# Patient Record
Sex: Female | Born: 1960 | Race: White | Hispanic: No | Marital: Married | Smoking: Never smoker
Health system: Southern US, Community
[De-identification: ages and names within clinical notes are randomized; demographics above are authoritative.]

## PROBLEM LIST (undated history)

## (undated) DIAGNOSIS — E785 Hyperlipidemia, unspecified: Secondary | ICD-10-CM

## (undated) DIAGNOSIS — F419 Anxiety disorder, unspecified: Secondary | ICD-10-CM

## (undated) HISTORY — PX: COLONOSCOPY: SHX174

## (undated) HISTORY — PX: POLYPECTOMY: SHX149

## (undated) HISTORY — DX: Anxiety disorder, unspecified: F41.9

---

## 2012-02-04 ENCOUNTER — Encounter: Payer: Self-pay | Admitting: Internal Medicine

## 2012-03-11 ENCOUNTER — Encounter: Payer: Self-pay | Admitting: Internal Medicine

## 2012-03-22 ENCOUNTER — Encounter: Payer: Self-pay | Admitting: Internal Medicine

## 2012-04-28 ENCOUNTER — Ambulatory Visit (AMBULATORY_SURGERY_CENTER): Payer: PRIVATE HEALTH INSURANCE | Admitting: *Deleted

## 2012-04-28 VITALS — Ht 66.0 in | Wt 164.0 lb

## 2012-04-28 DIAGNOSIS — Z1211 Encounter for screening for malignant neoplasm of colon: Secondary | ICD-10-CM

## 2012-04-28 MED ORDER — MOVIPREP 100 G PO SOLR: 1.0000 | Freq: Once | ORAL | Status: DC

## 2012-04-28 NOTE — Progress Notes (Signed)
No egg or soy allergy. ewm 

## 2012-05-10 ENCOUNTER — Telehealth: Payer: Self-pay | Admitting: Internal Medicine

## 2012-05-10 DIAGNOSIS — Z1211 Encounter for screening for malignant neoplasm of colon: Secondary | ICD-10-CM

## 2012-05-10 MED ORDER — MOVIPREP 100 G PO SOLR: 1.0000 | Freq: Once | ORAL | Status: DC

## 2012-05-10 NOTE — Telephone Encounter (Signed)
Re-sent moviprep to Pt's pharmacy

## 2012-05-12 ENCOUNTER — Ambulatory Visit (AMBULATORY_SURGERY_CENTER): Payer: PRIVATE HEALTH INSURANCE | Admitting: Internal Medicine

## 2012-05-12 ENCOUNTER — Encounter: Payer: Self-pay | Admitting: Internal Medicine

## 2012-05-12 VITALS — BP 124/92 | HR 76 | Temp 98.1°F | Resp 12 | Ht 66.0 in | Wt 164.0 lb

## 2012-05-12 DIAGNOSIS — D126 Benign neoplasm of colon, unspecified: Secondary | ICD-10-CM

## 2012-05-12 DIAGNOSIS — Z1211 Encounter for screening for malignant neoplasm of colon: Secondary | ICD-10-CM

## 2012-05-12 MED ORDER — SODIUM CHLORIDE 0.9 % IV SOLN: 500.0000 mL | INTRAVENOUS | Status: DC

## 2012-05-12 NOTE — Progress Notes (Signed)
Called to room to assist during endoscopic procedure.  Patient ID and intended procedure confirmed with present staff. Received instructions for my participation in the procedure from the performing physician.  

## 2012-05-12 NOTE — Patient Instructions (Signed)
Colon polyps x 2 removed today, see handouts. Hold aspirin, aspirin products, and anti-inflammatory medications for 1 week. Resume current medications. Call us with any questions or concerns. Thank you!!  YOU HAD AN ENDOSCOPIC PROCEDURE TODAY AT THE [facility]: Refer to the procedure report that was given to you for any specific questions about what was found during the examination.  If the procedure report does not answer your questions, please call your gastroenterologist to clarify.  If you requested that your care partner not be given the details of your procedure findings, then the procedure report has been included in a sealed envelope for you to review at your convenience later.  YOU SHOULD EXPECT: Some feelings of bloating in the abdomen. Passage of more gas than usual.  Walking can help get rid of the air that was put into your GI tract during the procedure and reduce the bloating. If you had a lower endoscopy (such as a colonoscopy or flexible sigmoidoscopy) you may notice spotting of blood in your stool or on the toilet paper. If you underwent a bowel prep for your procedure, then you may not have a normal bowel movement for a few days.  DIET: Your first meal following the procedure should be a light meal and then it is ok to progress to your normal diet.  A half-sandwich or bowl of soup is an example of a good first meal.  Heavy or fried foods are harder to digest and may make you feel nauseous or bloated.  Likewise meals heavy in dairy and vegetables can cause extra gas to form and this can also increase the bloating.  Drink plenty of fluids but you should avoid alcoholic beverages for 24 hours.  ACTIVITY: Your care partner should take you home directly after the procedure.  You should plan to take it easy, moving slowly for the rest of the day.  You can resume normal activity the day after the procedure however you should NOT DRIVE or use heavy machinery for 24 hours (because of  the sedation medicines used during the test).    SYMPTOMS TO REPORT IMMEDIATELY: A gastroenterologist can be reached at any hour.  During normal business hours, 8:30 AM to 5:00 PM Monday through Friday, call [phone].  After hours and on weekends, please call the GI answering service at [phone] who will take a message and have the physician on call contact you.   Following lower endoscopy (colonoscopy or flexible sigmoidoscopy):  Excessive amounts of blood in the stool  Significant tenderness or worsening of abdominal pains  Swelling of the abdomen that is new, acute  Fever of 100F or higher  FOLLOW UP: If any biopsies were taken you will be contacted by phone or by letter within the next 1-3 weeks.  Call your gastroenterologist if you have not heard about the biopsies in 3 weeks.  Our staff will call the home number listed on your records the next business day following your procedure to check on you and address any questions or concerns that you may have at that time regarding the information given to you following your procedure. This is a courtesy call and so if there is no answer at the home number and we have not heard from you through the emergency physician on call, we will assume that you have returned to your regular daily activities without incident.  SIGNATURES/CONFIDENTIALITY: You and/or your care partner have signed paperwork which will be entered into your electronic medical record.  These signatures attest to the fact that that the information above on your After Visit Summary has been reviewed and is understood.  Full responsibility of the confidentiality of this discharge information lies with you and/or your care-partner.  

## 2012-05-12 NOTE — Progress Notes (Signed)
Patient did not experience any of the following events: a burn prior to discharge; a fall within the facility; wrong site/side/patient/procedure/implant event; or a hospital transfer or hospital admission upon discharge from the facility. (G8907) Patient did not have preoperative order for IV antibiotic SSI prophylaxis. (G8918)  

## 2012-05-12 NOTE — Op Note (Signed)
[facility] [address] [place], [address]   COLONOSCOPY PROCEDURE REPORT  PATIENT: Angela Hensley, Angela Hensley  MR#: [id number] BIRTHDATE: 1960/11/03 , 51  yrs. old GENDER: Female ENDOSCOPIST: Beverley Fiedler, MD REFERRED QI:ONGEX, John PROCEDURE DATE:  05/12/2012 PROCEDURE:   Colonoscopy with snare polypectomy ASA CLASS:   Class II INDICATIONS:average risk screening and first colonoscopy. MEDICATIONS: MAC sedation, administered by CRNA and propofol (Diprivan) 350mg  IV  DESCRIPTION OF PROCEDURE:   After the risks benefits and alternatives of the procedure were thoroughly explained, informed consent was obtained.  A digital rectal exam revealed no rectal mass.   The LB [id number]-[id number]  endoscope was introduced through the anus and advanced to the terminal ileum which was intubated for a short distance. No adverse events experienced.   The quality of the prep was good, using MoviPrep  The instrument was then slowly withdrawn as the colon was fully examined.   COLON FINDINGS: The mucosa appeared normal in the terminal ileum. A pedunculated polyp measuring 7 mm in size was found at the hepatic flexure.  A polypectomy was performed using snare cautery. The resection was complete and the polyp tissue was completely retrieved.   A sessile polyp measuring 4 mm in size was found in the rectum.  A polypectomy was performed with a cold snare.  The resection was complete and the polyp tissue was completely retrieved.  Retroflexed views revealed no abnormalities. The time to cecum=7 minutes 27 seconds.  Withdrawal time=14 minutes 53 seconds.  The scope was withdrawn and the procedure completed. COMPLICATIONS: There were no complications.  ENDOSCOPIC IMPRESSION: 1.   Normal mucosa in the terminal ileum 2.   Pedunculated polyp measuring 7 mm in size was found at the hepatic flexure; polypectomy was performed using snare cautery 3.   Sessile polyp measuring 4 mm in size was found  in the rectum; polypectomy was performed with a cold snare  RECOMMENDATIONS: 1.  Hold aspirin, aspirin products, and anti-inflammatory medication for 1 week. 2.  Await pathology results 3.  If the polyps removed today are proven to be adenomatous (pre-cancerous) polyps, you will need a repeat colonoscopy in 5 years.  Otherwise you should continue to follow colorectal cancer screening guidelines for "routine risk" patients with colonoscopy in 10 years.  You will receive a letter within 1-2 weeks with the results of your biopsy as well as final recommendations.  Please call my office if you have not received a letter after 3 weeks.   eSigned:  Beverley Fiedler, MD 05/12/2012 11:19 AM        cc: Creola Corn, MD and The Patient

## 2012-05-13 ENCOUNTER — Telehealth: Payer: Self-pay | Admitting: *Deleted

## 2012-05-13 NOTE — Telephone Encounter (Signed)
  Follow up Call-  Call back number 05/12/2012  Post procedure Call Back phone  # [phone]  Permission to leave phone message Yes     Patient questions:  Do you have a fever, pain , or abdominal swelling? no Pain Score  0 *  Have you tolerated food without any problems? yes  Have you been able to return to your normal activities? yes  Do you have any questions about your discharge instructions: Diet   no Medications  no Follow up visit  no  Do you have questions or concerns about your Care? no  Actions: * If pain score is 4 or above: No action needed, pain <4.

## 2012-05-21 ENCOUNTER — Encounter: Payer: Self-pay | Admitting: Internal Medicine

## 2013-12-12 ENCOUNTER — Ambulatory Visit: Payer: Self-pay | Admitting: Chiropractic Medicine

## 2016-03-11 ENCOUNTER — Ambulatory Visit
Admission: EM | Admit: 2016-03-11 | Discharge: 2016-03-11 | Disposition: A | Discharge status: Home / Self Care | Payer: PRIVATE HEALTH INSURANCE | Attending: Family Medicine | Admitting: Family Medicine

## 2016-03-11 DIAGNOSIS — H1032 Unspecified acute conjunctivitis, left eye: Secondary | ICD-10-CM | POA: Diagnosis not present

## 2016-03-11 MED ORDER — GENTAMICIN SULFATE 0.3 % OP SOLN: 1.0000 [drp] | Freq: Four times a day (QID) | OPHTHALMIC | 0 refills | Status: DC

## 2016-03-11 MED ORDER — PREDNISONE 10 MG (21) PO TBPK: ORAL_TABLET | ORAL | 0 refills | Status: DC

## 2016-03-11 NOTE — ED Triage Notes (Signed)
Pt c/o redness, in her left eye for about 3 weeks.
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
## 2016-11-24 ENCOUNTER — Ambulatory Visit
Admission: EM | Admit: 2016-11-24 | Discharge: 2016-11-24 | Disposition: A | Discharge status: Home / Self Care | Payer: Self-pay | Attending: Family Medicine | Admitting: Family Medicine

## 2016-11-24 DIAGNOSIS — N898 Other specified noninflammatory disorders of vagina: Secondary | ICD-10-CM

## 2016-11-24 DIAGNOSIS — N390 Urinary tract infection, site not specified: Secondary | ICD-10-CM

## 2016-11-24 DIAGNOSIS — N3091 Cystitis, unspecified with hematuria: Secondary | ICD-10-CM

## 2016-11-24 DIAGNOSIS — R110 Nausea: Secondary | ICD-10-CM

## 2016-11-24 HISTORY — DX: Hyperlipidemia, unspecified: E78.5

## 2016-11-24 LAB — URINALYSIS, COMPLETE (UACMP) WITH MICROSCOPIC
Bilirubin Urine: NEGATIVE
GLUCOSE, UA: NEGATIVE mg/dL
KETONES UR: 15 mg/dL — AB
Nitrite: POSITIVE — AB
PROTEIN: 100 mg/dL — AB
Specific Gravity, Urine: 1.025 (ref 1.005–1.030)
pH: 6.5 (ref 5.0–8.0)

## 2016-11-24 MED ORDER — FLUCONAZOLE 150 MG PO TABS: 150.0000 mg | ORAL_TABLET | Freq: Once | ORAL | 0 refills | Status: AC

## 2016-11-24 MED ORDER — SULFAMETHOXAZOLE-TRIMETHOPRIM 800-160 MG PO TABS: 1.0000 | ORAL_TABLET | Freq: Two times a day (BID) | ORAL | 0 refills | Status: AC

## 2016-11-24 MED ORDER — PHENAZOPYRIDINE HCL 200 MG PO TABS: 200.0000 mg | ORAL_TABLET | Freq: Three times a day (TID) | ORAL | 0 refills | Status: DC | PRN
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
## 2016-11-24 NOTE — ED Triage Notes (Signed)
56 year old Caucasian woman is here today with complaints of polyuria, hematuria, dysuria that started today. She states she has been taking OTC Motrin with no relief. She states she does have history of UTIs with the last one being maybe about ren years ago. She denies lower back pain. She states she does have lower abdomen discomfort.

## 2016-11-27 LAB — URINE CULTURE

## 2017-05-26 ENCOUNTER — Encounter: Payer: Self-pay | Admitting: Internal Medicine

## 2017-12-02 ENCOUNTER — Other Ambulatory Visit: Payer: Self-pay

## 2017-12-02 ENCOUNTER — Encounter: Payer: Self-pay | Admitting: Emergency Medicine

## 2017-12-02 ENCOUNTER — Ambulatory Visit
Admission: EM | Admit: 2017-12-02 | Discharge: 2017-12-02 | Disposition: A | Discharge status: Home / Self Care | Payer: Self-pay | Attending: Family Medicine | Admitting: Family Medicine

## 2017-12-02 DIAGNOSIS — R300 Dysuria: Secondary | ICD-10-CM

## 2017-12-02 LAB — URINALYSIS, COMPLETE (UACMP) WITH MICROSCOPIC
Bilirubin Urine: NEGATIVE
GLUCOSE, UA: NEGATIVE mg/dL
KETONES UR: NEGATIVE mg/dL
Nitrite: NEGATIVE
PH: 5.5 (ref 5.0–8.0)
PROTEIN: NEGATIVE mg/dL
Specific Gravity, Urine: 1.015 (ref 1.005–1.030)
WBC, UA: >50 WBC/hpf (ref 0–5)

## 2017-12-02 MED ORDER — NITROFURANTOIN MONOHYD MACRO 100 MG PO CAPS: 100.0000 mg | ORAL_CAPSULE | Freq: Two times a day (BID) | ORAL | 0 refills | Status: DC

## 2017-12-02 NOTE — ED Triage Notes (Signed)
Patient c/o dysuria and frequency that started on Sunday. Denies fever. Denies vaginal discharge.

## 2017-12-02 NOTE — Discharge Instructions (Addendum)
Take medication as prescribed. Rest. Drink plenty of fluids.  ° °Follow up with your primary care physician this week as needed. Return to Urgent care for new or worsening concerns.  ° °

## 2017-12-02 NOTE — ED Provider Notes (Signed)
[facility] ____________________________________________  Time seen: Approximately 12:35 PM  I have reviewed the triage vital signs and the nursing notes.   HISTORY  Chief Complaint Dysuria   HPI Angela Hensley is a 57 y.o. female presenting for evaluation of 2 to 3 days of dysuria.  Patient states symptoms started on Sunday, but improved after drinking a lot of water, but then returned prompting evaluation.  Patient reports urinary frequency with some discomfort during active urination.  Denies abdominal pain, back pain, fevers, vomiting diarrhea.  Does report recent hiking trip which may have been a trigger for urinary changes.  Has not taken any over-the-counter medication for the same complaints.  Denies aggravating or alleviating factors.  Reports otherwise feels well.  Denies recent sickness. Denies recent antibiotic use.    Past Medical History:  Diagnosis Date  . Anxiety   . Hyperlipidemia     There are no active problems to display for this patient.   History reviewed. No pertinent surgical history.   No current facility-administered medications for this encounter.   Current Outpatient Medications:  .  nitrofurantoin, macrocrystal-monohydrate, (MACROBID) 100 MG capsule, Take 1 capsule (100 mg total) by mouth 2 (two) times daily., Disp: 10 capsule, Rfl: 0  Allergies Penicillins  Family History  Problem Relation Age of Onset  . Diabetes Mother   . Diabetes Father   . Breast cancer Maternal Grandmother   . Colon cancer Neg Hx   . Rectal cancer Neg Hx   . Stomach cancer Neg Hx     Social History Social History   Tobacco Use  . Smoking status: Never Smoker  . Smokeless tobacco: Never Used  Substance Use Topics  . Alcohol use: Yes    Comment: 2-4 glasses of wine/week  . Drug use: No    Review of Systems Constitutional: No fever/chills Cardiovascular: Denies chest pain. Respiratory: Denies shortness of breath. Gastrointestinal: No  abdominal pain.  No nausea, no vomiting.  No diarrhea.  Genitourinary: positive for dysuria. Musculoskeletal: Negative for back pain. Skin: Negative for rash.   ____________________________________________   PHYSICAL EXAM:  VITAL SIGNS: ED Triage Vitals  Enc Vitals Group     BP 12/02/17 1206 124/84     Pulse Rate 12/02/17 1206 68     Resp 12/02/17 1206 18     Temp 12/02/17 1206 98.1 F (36.7 C)     Temp Source 12/02/17 1206 Oral     SpO2 12/02/17 1206 98 %     Weight 12/02/17 1204 155 lb (70.3 kg)     Height 12/02/17 1204 5\' 6"  (1.676 m)     Head Circumference --      Peak Flow --      Pain Score 12/02/17 1204 5     Pain Loc --      Pain Edu? --      Excl. in [place]? --     Constitutional: Alert and oriented. Well appearing and in no acute distress. ENT      Head: Normocephalic and atraumatic. Cardiovascular: Normal rate, regular rhythm. Grossly normal heart sounds.  Good peripheral circulation. Respiratory: Normal respiratory effort without tachypnea nor retractions. Breath sounds are clear and equal bilaterally. No wheezes, rales, rhonchi. Gastrointestinal: Soft and nontender.  No CVA tenderness. Musculoskeletal: No midline cervical, thoracic or lumbar tenderness to palpation.  Neurologic:  Normal speech and language.  Speech is normal. No gait instability.  Skin:  Skin is warm, dry Psychiatric: Mood and affect are normal. Speech and behavior are  normal. Patient exhibits appropriate insight and judgment   ___________________________________________   LABS (all labs ordered are listed, but only abnormal results are displayed)  Labs Reviewed  URINALYSIS, COMPLETE (UACMP) WITH MICROSCOPIC - Abnormal; Notable for the following components:      Result Value   Color, Urine STRAW (*)    APPearance HAZY (*)    Hgb urine dipstick TRACE (*)    Leukocytes, UA MODERATE (*)    Bacteria, UA FEW (*)    All other components within normal limits  URINE CULTURE     PROCEDURES Procedures    INITIAL IMPRESSION / ASSESSMENT AND PLAN / ED COURSE  Pertinent labs & imaging results that were available during my care of the patient were reviewed by me and considered in my medical decision making (see chart for details).  Well-appearing patient.  No acute distress.  Presented for evaluation of dysuria.  Discussed with patient urinalysis not clear UTI, however suspect UTI will culture.  Will treat with Macrobid.  Encourage rest, fluids, supportive care.Discussed indication, risks and benefits of medications with patient.  Discussed follow up with Primary care physician this week. Discussed follow up and return parameters including no resolution or any worsening concerns. Patient verbalized understanding and agreed to plan.   ____________________________________________   FINAL CLINICAL IMPRESSION(S) / ED DIAGNOSES  Final diagnoses:  Dysuria     ED Discharge Orders         Ordered    nitrofurantoin, macrocrystal-monohydrate, (MACROBID) 100 MG capsule  2 times daily     12/02/17 1232           Note: This dictation was prepared with Dragon dictation along with smaller phrase technology. Any transcriptional errors that result from this process are unintentional.         Marylene Land, NP 12/02/17 [place]

## 2017-12-04 LAB — URINE CULTURE: Culture: >=100000 — AB

## 2017-12-07 ENCOUNTER — Telehealth (HOSPITAL_COMMUNITY): Payer: Self-pay

## 2017-12-07 NOTE — Telephone Encounter (Signed)
Urine culture positive for e. Coli this was treated with Macrobid at ucc visit. Pt called and made aware.

## 2018-01-30 ENCOUNTER — Ambulatory Visit
Admission: EM | Admit: 2018-01-30 | Discharge: 2018-01-30 | Disposition: A | Discharge status: Home / Self Care | Payer: Self-pay | Attending: Family Medicine | Admitting: Family Medicine

## 2018-01-30 ENCOUNTER — Other Ambulatory Visit: Payer: Self-pay

## 2018-01-30 DIAGNOSIS — J0100 Acute maxillary sinusitis, unspecified: Secondary | ICD-10-CM

## 2018-01-30 DIAGNOSIS — J40 Bronchitis, not specified as acute or chronic: Secondary | ICD-10-CM

## 2018-01-30 MED ORDER — PREDNISONE 20 MG PO TABS: 40.0000 mg | ORAL_TABLET | Freq: Every day | ORAL | 0 refills | Status: DC

## 2018-01-30 MED ORDER — DOXYCYCLINE HYCLATE 100 MG PO CAPS: 100.0000 mg | ORAL_CAPSULE | Freq: Two times a day (BID) | ORAL | 0 refills | Status: DC

## 2018-01-30 MED ORDER — ALBUTEROL SULFATE HFA 108 (90 BASE) MCG/ACT IN AERS: 2.0000 | INHALATION_SPRAY | RESPIRATORY_TRACT | 0 refills | Status: DC | PRN

## 2018-01-30 NOTE — ED Triage Notes (Addendum)
"  I've had sinus issues for past couple weeks." Ears feel full. Coughing up green phlegm at times, other times it is clear.

## 2018-01-30 NOTE — Discharge Instructions (Signed)
Take medication as prescribed. Rest. Drink plenty of fluids.  ° °Follow up with your primary care physician this week as needed. Return to Urgent care for new or worsening concerns.  ° °

## 2018-01-30 NOTE — ED Provider Notes (Signed)
[facility] ____________________________________________  Time seen: Approximately 10:29 AM  I have reviewed the triage vital signs and the nursing notes.   HISTORY  Chief Complaint Facial Pain  HPI Angela Hensley is a 57 y.o. female presenting for evaluation of 3 weeks of runny nose, nasal congestion, sinus pressure, postnasal drainage and approximate 1 week of coughing.  Reports does occasionally hear a wheeze when she is coughing.  Reports possible fever at initial sickness onset, no recent fevers.  Continues to eat and drink well.  Has taken multiple over-the-counter cough and congestion medications as well as herbal supplements without resolution, but reports they do help.  Denies any associated chest pain, shortness of breath, hemoptysis.  Reports her family recently with some similar sickness as well.  Denies other aggravating alleviating factors.  Denies recent cough or congestion sickness. .   Past Medical History:  Diagnosis Date  . Anxiety   . Hyperlipidemia     There are no active problems to display for this patient.   History reviewed. No pertinent surgical history.   No current facility-administered medications for this encounter.   Current Outpatient Medications:  .  albuterol (PROVENTIL HFA;VENTOLIN HFA) 108 (90 Base) MCG/ACT inhaler, Inhale 2 puffs into the lungs every 4 (four) hours as needed for wheezing., Disp: 1 Inhaler, Rfl: 0 .  doxycycline (VIBRAMYCIN) 100 MG capsule, Take 1 capsule (100 mg total) by mouth 2 (two) times daily., Disp: 20 capsule, Rfl: 0 .  predniSONE (DELTASONE) 20 MG tablet, Take 2 tablets (40 mg total) by mouth daily., Disp: 10 tablet, Rfl: 0  Allergies Penicillins  Family History  Problem Relation Age of Onset  . Diabetes Mother   . Diabetes Father   . Breast cancer Maternal Grandmother   . Colon cancer Neg Hx   . Rectal cancer Neg Hx   . Stomach cancer Neg Hx     Social History Social History   Tobacco Use    . Smoking status: Never Smoker  . Smokeless tobacco: Never Used  Substance Use Topics  . Alcohol use: Yes    Comment: 2-4 glasses of wine/week  . Drug use: No    Review of Systems Constitutional: No recent fever. ENT: Some sore throat. As above.  Cardiovascular: Denies chest pain. Respiratory: Denies shortness of breath. Gastrointestinal: No abdominal pain.   Musculoskeletal: Negative for back pain. Skin: Negative for rash.  ____________________________________________   PHYSICAL EXAM:  VITAL SIGNS: ED Triage Vitals  Enc Vitals Group     BP 01/30/18 0950 122/89     Pulse Rate 01/30/18 0950 72     Resp 01/30/18 0950 16     Temp 01/30/18 0950 98.2 F (36.8 C)     Temp Source 01/30/18 0950 Oral     SpO2 01/30/18 0950 98 %     Weight 01/30/18 0951 154 lb 15.7 oz (70.3 kg)     Height 01/30/18 0951 5\' 6"  (1.676 m)     Head Circumference --      Peak Flow --      Pain Score 01/30/18 0951 0     Pain Loc --      Pain Edu? --      Excl. in [place]? --    Constitutional: Alert and oriented. Well appearing and in no acute distress. Eyes: Conjunctivae are normal.  Head: Atraumatic.Mild tenderness to palpation bilateral maxillary sinuses.No frontal sinus tenderness. No swelling. No erythema.   Ears: no erythema, normal TMs bilaterally.   Nose: nasal congestion  with bilateral nasal turbinate erythema and edema.   Mouth/Throat: Mucous membranes are moist.  Oropharynx non-erythematous.No tonsillar swelling or exudate.  Neck: No stridor.  No cervical spine tenderness to palpation. Hematological/Lymphatic/Immunilogical: No cervical lymphadenopathy. Cardiovascular: Normal rate, regular rhythm. Grossly normal heart sounds.  Good peripheral circulation. Respiratory: Normal respiratory effort.  No retractions.  Mild scattered rhonchi.  Minimal scattered inspiratory wheezes.  Intermittent cough noted in room with mild bronchospasm.  Speaks in complete senses.  Good air movement.   Musculoskeletal: Steady gait.  Neurologic:  Normal speech and language. No gait instability. Skin:  Skin is warm, dry and intact. No rash noted. Psychiatric: Mood and affect are normal. Speech and behavior are normal.  ___________________________________________   LABS (all labs ordered are listed, but only abnormal results are displayed)  Labs Reviewed - No data to display   PROCEDURES Procedures   INITIAL IMPRESSION / ASSESSMENT AND PLAN / ED COURSE  Pertinent labs & imaging results that were available during my care of the patient were reviewed by me and considered in my medical decision making (see chart for details).  Well-appearing patient.  No acute distress.  Suspect recent viral upper respiratory infection.  Suspect secondary sinusitis and bronchitis.  Will treat with oral doxycycline, prednisone and PRN albuterol inhaler.  Encourage rest, fluids, supportive care.Discussed indication, risks and benefits of medications with patient.   Discussed follow up with Primary care physician this week. Discussed follow up and return parameters including no resolution or any worsening concerns. Patient verbalized understanding and agreed to plan.   ____________________________________________   FINAL CLINICAL IMPRESSION(S) / ED DIAGNOSES  Final diagnoses:  Acute maxillary sinusitis, recurrence not specified  Bronchitis     ED Discharge Orders         Ordered    doxycycline (VIBRAMYCIN) 100 MG capsule  2 times daily     01/30/18 1015    predniSONE (DELTASONE) 20 MG tablet  Daily     01/30/18 1015    albuterol (PROVENTIL HFA;VENTOLIN HFA) 108 (90 Base) MCG/ACT inhaler  Every 4 hours PRN     01/30/18 1015           Note: This dictation was prepared with Dragon dictation along with smaller phrase technology. Any transcriptional errors that result from this process are unintentional.         Marylene Land, NP 01/30/18 1054

## 2018-02-14 ENCOUNTER — Ambulatory Visit
Admission: EM | Admit: 2018-02-14 | Discharge: 2018-02-14 | Disposition: A | Discharge status: Home / Self Care | Payer: Self-pay | Attending: Emergency Medicine | Admitting: Emergency Medicine

## 2018-02-14 ENCOUNTER — Other Ambulatory Visit: Payer: Self-pay

## 2018-02-14 DIAGNOSIS — R05 Cough: Secondary | ICD-10-CM

## 2018-02-14 DIAGNOSIS — R059 Cough, unspecified: Secondary | ICD-10-CM

## 2018-02-14 DIAGNOSIS — J9801 Acute bronchospasm: Secondary | ICD-10-CM

## 2018-02-14 MED ORDER — FLUTICASONE PROPIONATE 50 MCG/ACT NA SUSP: 2.0000 | Freq: Every day | NASAL | 0 refills | Status: DC

## 2018-02-14 MED ORDER — AZITHROMYCIN 250 MG PO TABS: 250.0000 mg | ORAL_TABLET | Freq: Every day | ORAL | 0 refills | Status: DC

## 2018-02-14 MED ORDER — PREDNISONE 10 MG (21) PO TBPK: ORAL_TABLET | ORAL | 0 refills | Status: DC

## 2018-02-14 MED ORDER — AEROCHAMBER PLUS MISC: 2 refills | Status: DC

## 2018-02-14 NOTE — ED Provider Notes (Signed)
HPI  SUBJECTIVE:  Angela Hensley is a 57 y.o. female who presents with persistent maxillary sinus pain and pressure, greenish nasal congestion, postnasal drip, cough productive of green sputum.  She was seen here 2 weeks ago for a cough and sinus issues, treated with doxycycline, albuterol inhaler, prednisone.  States that she finished the Doxy 5 days ago.  She states that overall she is improving significantly, but is concerned because of the continued symptoms.  No recurrent fevers since finishing the antibiotics, no rhinorrhea, no upper dental pain.  No sore throat.  No wheezing, chest pain, shortness of breath.  She reports chest congestion that clears with coughing.  No allergy symptoms.  She tried Echinacea, elderberry.  She is not using her albuterol inhaler now.  She states that the prednisone, doxycycline and herbal remedies have made her feel better.  Symptoms are worse with talking too much.  States that she is sleeping well at night.  Past medical history negative for asthma, emphysema, COPD, smoking, vaping, diabetes, hypertension.  PMD: Dr. Elizabeth Sauer.   Past Medical History:  Diagnosis Date  . Anxiety   . Hyperlipidemia     History reviewed. No pertinent surgical history.  Family History  Problem Relation Age of Onset  . Diabetes Mother   . Diabetes Father   . Breast cancer Maternal Grandmother   . Colon cancer Neg Hx   . Rectal cancer Neg Hx   . Stomach cancer Neg Hx     Social History   Tobacco Use  . Smoking status: Never Smoker  . Smokeless tobacco: Never Used  Substance Use Topics  . Alcohol use: Yes    Comment: 2-4 glasses of wine/week  . Drug use: No    No current facility-administered medications for this encounter.   Current Outpatient Medications:  .  albuterol (PROVENTIL HFA;VENTOLIN HFA) 108 (90 Base) MCG/ACT inhaler, Inhale 2 puffs into the lungs every 4 (four) hours as needed for wheezing., Disp: 1 Inhaler, Rfl: 0 .  azithromycin (ZITHROMAX) 250 MG  tablet, Take 1 tablet (250 mg total) by mouth daily. 2 tabs po on day 1, 1 tab po on days 2-5, Disp: 6 tablet, Rfl: 0 .  fluticasone (FLONASE) 50 MCG/ACT nasal spray, Place 2 sprays into both nostrils daily., Disp: 16 g, Rfl: 0 .  predniSONE (STERAPRED UNI-PAK 21 TAB) 10 MG (21) TBPK tablet, Dispense one 6 day pack. Take as directed with food., Disp: 21 tablet, Rfl: 0 .  Spacer/Aero-Holding Chambers (AEROCHAMBER PLUS) inhaler, Use as instructed, Disp: 1 each, Rfl: 2  Allergies  Allergen Reactions  . Penicillins Rash     ROS  As noted in HPI.   Physical Exam  BP (!) 138/91 (BP Location: Right Arm)   Pulse 78   Temp 98 F (36.7 C) (Oral)   Resp 18   Ht 5\' 6"  (1.676 m)   Wt 70.3 kg   SpO2 97%   BMI 25.01 kg/m   Constitutional: Well developed, well nourished, no acute distress Eyes:  EOMI, conjunctiva normal bilaterally HENT: Normocephalic, atraumatic,mucus membranes moist.  No purulent nasal congestion.  Slightly erythematous, swollen turbinates.  No sinus tenderness.  Unable to visualize oropharynx due to patient cooperation. Respiratory: Normal inspiratory effort. wheezing throughout all lung fields.  No rales, rhonchi.  No chest wall tenderness.  Fair air movement. Cardiovascular: Normal rate, regular rhythm, no murmurs, rubs, gallops GI: nondistended skin: No rash, skin intact Musculoskeletal: no deformities Neurologic: Alert & oriented x 3, no focal neuro deficits Psychiatric:  Speech and behavior appropriate   ED Course   Medications - No data to display  No orders of the defined types were placed in this encounter.   No results found for this or any previous visit (from the past 24 hour(s)). No results found.  ED Clinical Impression  Cough  Bronchospasm   ED Assessment/Plan  Patient has wheezing throughout all lung fields, suspect continued inflammation/bronchospasm.  Will send home with a spacer and have her restart her albuterol 2 puffs every 4-6 hours  using her spacer as needed for coughing, wheezing.  will start some Flonase for the postnasal drip and saline nasal irrigation with a Milta Deiters med rinse and distilled water.  Will send home with another burst of steroids and a wait-and-see of azithromycin as she is allergic to penicillins.  This will cover pneumonia.  I do not think that she has a peristent sinus infection although I think that her cough is from the postnasal drip.Marland Kitchen  Discussed  MDM, treatment plan, and plan for follow-up with patient.. patient agrees with plan.   Meds ordered this encounter  Medications  . fluticasone (FLONASE) 50 MCG/ACT nasal spray    Sig: Place 2 sprays into both nostrils daily.    Dispense:  16 g    Refill:  0  . Spacer/Aero-Holding Chambers (AEROCHAMBER PLUS) inhaler    Sig: Use as instructed    Dispense:  1 each    Refill:  2  . predniSONE (STERAPRED UNI-PAK 21 TAB) 10 MG (21) TBPK tablet    Sig: Dispense one 6 day pack. Take as directed with food.    Dispense:  21 tablet    Refill:  0  . azithromycin (ZITHROMAX) 250 MG tablet    Sig: Take 1 tablet (250 mg total) by mouth daily. 2 tabs po on day 1, 1 tab po on days 2-5    Dispense:  6 tablet    Refill:  0    *This clinic note was created using [employer]. Therefore, there may be occasional mistakes despite careful proofreading.   ?   Melynda Ripple, MD 02/14/18 1430

## 2018-02-14 NOTE — Discharge Instructions (Addendum)
2 Puffs from your albuterol inhaler using your spacer every 4-6 hours as needed for coughing and wheezing . Flonase for the postnasal drip and saline nasal irrigation with a Milta Deiters med rinse and distilled water.  another burst of steroids and a wait-and-see of azithromycin.  This cough may linger for another week or so.

## 2018-02-14 NOTE — ED Triage Notes (Signed)
Pt seen here 2 weeks ago for similar sx and sinusitis. Still having cough and nasal drainage both green. Wants to get this taken care of before holidays start

## 2018-09-21 ENCOUNTER — Encounter: Payer: Self-pay | Admitting: Emergency Medicine

## 2018-09-21 ENCOUNTER — Ambulatory Visit
Admission: EM | Admit: 2018-09-21 | Discharge: 2018-09-21 | Disposition: A | Discharge status: Home / Self Care | Payer: Self-pay | Attending: Urgent Care | Admitting: Urgent Care

## 2018-09-21 ENCOUNTER — Other Ambulatory Visit: Payer: Self-pay

## 2018-09-21 DIAGNOSIS — R300 Dysuria: Secondary | ICD-10-CM

## 2018-09-21 LAB — URINALYSIS, COMPLETE (UACMP) WITH MICROSCOPIC
Bilirubin Urine: NEGATIVE
Glucose, UA: NEGATIVE mg/dL
Hgb urine dipstick: NEGATIVE
Ketones, ur: NEGATIVE mg/dL
Nitrite: NEGATIVE
Protein, ur: NEGATIVE mg/dL
Specific Gravity, Urine: 1.02 (ref 1.005–1.030)
pH: 7 (ref 5.0–8.0)

## 2018-09-21 MED ORDER — NITROFURANTOIN MONOHYD MACRO 100 MG PO CAPS: 100.0000 mg | ORAL_CAPSULE | Freq: Two times a day (BID) | ORAL | 0 refills | Status: AC

## 2018-09-21 NOTE — ED Provider Notes (Signed)
[address] [place], [place] [address] [phone]   Name: Angela Hensley DOB: 1960/06/01 MRN: [id number] CSN: [id number] PCP: Shon Baton, MD  Arrival date and time:  09/21/18 0940  Chief Complaint:  Dysuria  NOTE: Prior to seeing the patient today, I have reviewed the triage nursing documentation and vital signs. Clinical staff has updated patient's PMH/PSHx, current medication list, and drug allergies/intolerances to ensure comprehensive history available to assist in medical decision making.   History:   HPI: Angela Hensley is a 58 y.o. female who presents today with complaints of urinary symptoms that began with acute onset 2 days ago. Patient experiencing dysuria (burning) and urgency. No gross hematuria or malodorous urine. She has not noted any measurable elevations in her temperature. Patient advising that she recently went on an extended car trip and didn't make stops to void like she should have. She adds that in the past, this has led to the development of urinary tract infections. Patient denies pain in her back and flank area. She notes mild suprapubic pressure. No vaginal pain, bleeding or discharge.   Past Medical History:  Diagnosis Date  . Anxiety   . Hyperlipidemia     History reviewed. No pertinent surgical history.  Family History  Problem Relation Age of Onset  . Diabetes Mother   . Diabetes Father   . Breast cancer Maternal Grandmother   . Colon cancer Neg Hx   . Rectal cancer Neg Hx   . Stomach cancer Neg Hx     Social History   Socioeconomic History  . Marital status: Married    Spouse name: Not on file  . Number of children: Not on file  . Years of education: Not on file  . Highest education level: Not on file  Occupational History  . Not on file  Social Needs  . Financial resource strain: Not on file  . Food insecurity:    Worry: Not on file    Inability: Not on file  . Transportation needs:    Medical: Not on file   Non-medical: Not on file  Tobacco Use  . Smoking status: Never Smoker  . Smokeless tobacco: Never Used  Substance and Sexual Activity  . Alcohol use: Yes    Comment: 2-4 glasses of wine/week  . Drug use: No  . Sexual activity: Yes    Birth control/protection: Post-menopausal  Lifestyle  . Physical activity:    Days per week: Not on file    Minutes per session: Not on file  . Stress: Not on file  Relationships  . Social connections:    Talks on phone: Not on file    Gets together: Not on file    Attends religious service: Not on file    Active member of club or organization: Not on file    Attends meetings of clubs or organizations: Not on file    Relationship status: Not on file  . Intimate partner violence:    Fear of current or ex partner: Not on file    Emotionally abused: Not on file    Physically abused: Not on file    Forced sexual activity: Not on file  Other Topics Concern  . Not on file  Social History Narrative  . Not on file    There are no active problems to display for this patient.   Home Medications:    No outpatient medications have been marked as taking for the 09/21/18 encounter [facility] Encounter).    Allergies:  Penicillins  Review of Systems (ROS): Review of Systems  Constitutional: Negative for chills and fever.  Respiratory: Negative for cough and shortness of breath.   Cardiovascular: Negative for chest pain and palpitations.  Gastrointestinal: Negative for nausea and vomiting.       (+) suprapubic pressure  Genitourinary: Positive for dysuria (burning) and urgency. Negative for decreased urine volume, flank pain, frequency, hematuria, pelvic pain, vaginal bleeding, vaginal discharge and vaginal pain.  Musculoskeletal: Positive for back pain.  Neurological: Negative.      Physical Exam:  Triage Vital Signs ED Triage Vitals  Enc Vitals Group     BP 09/21/18 0951 127/85     Pulse Rate 09/21/18 0951 68     Resp 09/21/18 0951 16      Temp 09/21/18 0951 98 F (36.7 C)     Temp Source 09/21/18 0951 Oral     SpO2 09/21/18 0951 99 %     Weight 09/21/18 0949 155 lb (70.3 kg)     Height 09/21/18 0949 5\' 6"  (1.676 m)     Head Circumference --      Peak Flow --      Pain Score 09/21/18 0948 4     Pain Loc --      Pain Edu? --      Excl. in [place]? --     Physical Exam  Constitutional: She is oriented to person, place, and time and well-developed, well-nourished, and in no distress.  HENT:  Head: Normocephalic and atraumatic.  Mouth/Throat: Oropharynx is clear and moist and mucous membranes are normal.  Cardiovascular: Normal rate, regular rhythm, normal heart sounds and intact distal pulses. Exam reveals no gallop and no friction rub.  No murmur heard. Pulmonary/Chest: Effort normal and breath sounds normal. No respiratory distress. She has no wheezes. She has no rales.  Abdominal: Soft. Normal appearance and bowel sounds are normal. There is no hepatosplenomegaly. There is abdominal tenderness in the suprapubic area. There is no CVA tenderness.  Neurological: She is alert and oriented to person, place, and time.  Skin: Skin is warm and dry. No rash noted. No erythema.  Psychiatric: Mood, affect and judgment normal.  Nursing note and vitals reviewed.    Urgent Care Treatments / Results:   LABS: PLEASE NOTE: all labs that were ordered this encounter are listed, however only abnormal results are displayed. Labs Reviewed  URINALYSIS, COMPLETE (UACMP) WITH MICROSCOPIC - Abnormal; Notable for the following components:      Result Value   APPearance CLOUDY (*)    Leukocytes,Ua SMALL (*)    Bacteria, UA FEW (*)    All other components within normal limits    EKG: -None  RADIOLOGY: No results found.  PRODEDURES: Procedures  MEDICATIONS RECEIVED THIS VISIT: Medications - No data to display  PERTINENT CLINICAL COURSE NOTES/UPDATES:   Initial Impression / Assessment and Plan / Urgent Care Course:    Angela Hensley is a 59 y.o. female who presents to [facility] today with complaints of Dysuria  Pertinent labs & imaging results that were available during my care of the patient were personally reviewed by me and considered in my medical decision making (see lab/imaging section of note for values and interpretations).  Exam reveals some mild suprapubic pressure. She notes dysuria and urgency. No back or flank pain. UA not concerning for infection. Patient notes that symptoms are early. She "had time today' thus came in before things "got bad" in terms of her symptoms. Patient encouraged  to increase fluid intake to flush urinary tract. Discussed avoiding caffeine to prevent painful bladder spasms. Offered pyridium, however patient refused citing that she would be fine with APAP and IBU alone. Patient adamant that symptoms are early and that she is developing an infection; "I always feel like this". Dicussed a provisional Rx for antibiotics for patient to fill should symptoms increase. Patient in agreement. She states, "I will increase my fluids and try to flush it out. I don't want to take the medicine if I don't have to. I will wait a few days". Treatment approach reasonable. Past urine C&S results reviewed demonstrating resistance to SMZ-TMP. Patient is PCN allergic. Will provide provisional Rx for a 5 day course of nitrofurantoin.  Discussed follow up with primary care physician in 1 week for re-evaluation. I have reviewed the follow up and strict return precautions for any new or worsening symptoms. Patient is aware of symptoms that would be deemed urgent/emergent, and would thus require further evaluation either here or in the emergency department. At the time of discharge, she verbalized understanding and consent with the discharge plan as it was reviewed with her. All questions were fielded by provider and/or clinic staff prior to patient discharge.    Final Clinical Impressions(s) / Urgent Care  Diagnoses:   Final diagnoses:  Dysuria    New Prescriptions:   Meds ordered this encounter  Medications  . nitrofurantoin, macrocrystal-monohydrate, (MACROBID) 100 MG capsule    Sig: Take 1 capsule (100 mg total) by mouth 2 (two) times daily for 5 days.    Dispense:  10 capsule    Refill:  0    Controlled Substance Prescriptions:  [place] Controlled Substance Registry consulted? Not Applicable  NOTE: This note was prepared using Dragon dictation software along with smaller phrase technology. Despite my best ability to proofread, there is the potential that transcriptional errors may still occur from this process, and are completely unintentional.     Karen Kitchens, NP 09/22/18 [phone]

## 2018-09-21 NOTE — ED Triage Notes (Signed)
Patient c/o burning when urinating and urinary urgency for the past 2 days.  Patient denies fevers.

## 2018-09-21 NOTE — Discharge Instructions (Addendum)
It was very nice meeting you today in clinic. Thank you for entrusting me with your care.   As discussed, your urine shows early possible infection. Will approach treatment as follows:  Prescription has been sent to your pharmacy. If symptoms worsen over the next few days, please pick up prescription. If you start it, please FINISH the entire course of antibiotics even if you are feeling better.  A culture will be sent on your provided sample. If it comes back resistant to what I have prescribed you, someone will call you and let you know that we will need to change antibiotics. Increase fluid intake as much as possible to flush your urinary tract.  Water is always the best.  Avoid caffeine until your infection clears up, as it can contribute to painful bladder spasms.  May use Tylenol and/or Ibuprofen as needed for pain/fever.  Make arrangements to follow up with your regular doctor in 1 week for re-evaluation. If your symptoms/condition worsens, please seek follow up care either here or in the ER. Please remember, our [place] providers are "right here with you" when you need [place].   Again, it was my pleasure to take care of you today. Thank you for choosing our clinic. I hope that you start to feel better quickly.   Honor Loh, MSN, APRN, FNP-C, CEN Advanced Practice Provider [facility]

## 2019-07-01 ENCOUNTER — Encounter: Payer: Self-pay | Admitting: Internal Medicine

## 2019-07-04 ENCOUNTER — Other Ambulatory Visit: Payer: Self-pay | Admitting: Internal Medicine

## 2019-07-04 DIAGNOSIS — E785 Hyperlipidemia, unspecified: Secondary | ICD-10-CM

## 2019-08-19 ENCOUNTER — Other Ambulatory Visit: Payer: Self-pay

## 2019-08-19 ENCOUNTER — Ambulatory Visit (AMBULATORY_SURGERY_CENTER): Payer: Self-pay | Admitting: *Deleted

## 2019-08-19 VITALS — Temp 97.1°F | Ht 65.5 in | Wt 161.0 lb

## 2019-08-19 DIAGNOSIS — Z86010 Personal history of colonic polyps: Secondary | ICD-10-CM

## 2019-08-19 MED ORDER — PEG 3350-KCL-NA BICARB-NACL 420 G PO SOLR: 4000.0000 mL | Freq: Once | ORAL | 0 refills | Status: AC

## 2019-08-19 NOTE — Progress Notes (Signed)
No egg or soy allergy known to patient  No issues with past sedation with any surgeries  or procedures, no intubation problems  No diet pills per patient No home 02 use per patient  No blood thinners per patient  Pt denies issues with constipation  No A fib or A flutter  EMMI video sent to pt's e mail   Completed Levan Hurst 08-03-2019   Due to the COVID-19 pandemic we are asking patients to follow these guidelines. Please only bring one care partner. Please be aware that your care partner may wait in the car in the parking lot or if they feel like they will be too hot to wait in the car, they may wait in the lobby on the 4th floor. All care partners are required to wear a mask the entire time (we do not have any that we can provide them), they need to practice social distancing, and we will do a Covid check for all patient's and care partners when you arrive. Also we will check their temperature and your temperature. If the care partner waits in their car they need to stay in the parking lot the entire time and we will call them on their cell phone when the patient is ready for discharge so they can bring the car to the front of the building. Also all patient's will need to wear a mask into building.

## 2019-08-26 ENCOUNTER — Ambulatory Visit (AMBULATORY_SURGERY_CENTER): Payer: Self-pay | Admitting: Internal Medicine

## 2019-08-26 ENCOUNTER — Encounter: Payer: Self-pay | Admitting: Internal Medicine

## 2019-08-26 ENCOUNTER — Other Ambulatory Visit: Payer: Self-pay

## 2019-08-26 VITALS — BP 107/65 | HR 59 | Temp 96.9°F | Resp 12 | Ht 65.5 in | Wt 161.0 lb

## 2019-08-26 DIAGNOSIS — D124 Benign neoplasm of descending colon: Secondary | ICD-10-CM

## 2019-08-26 DIAGNOSIS — Z86010 Personal history of colonic polyps: Secondary | ICD-10-CM

## 2019-08-26 DIAGNOSIS — K635 Polyp of colon: Secondary | ICD-10-CM

## 2019-08-26 MED ORDER — SODIUM CHLORIDE 0.9 % IV SOLN: 500.0000 mL | Freq: Once | INTRAVENOUS | Status: DC

## 2019-08-26 NOTE — Patient Instructions (Signed)
HANDOUTS PROVIDED ON: POLYPS & DIVERTICULOSIS  The polyps removed today have been sent for pathology.  The results can take 1-3 weeks to receive.  When your next colonoscopy should occur will be based on the pathology results.    You may resume your previous diet and medication schedule.  Thank you for allowing us to care for you today!!!   YOU HAD AN ENDOSCOPIC PROCEDURE TODAY AT THE [facility]:   Refer to the procedure report that was given to you for any specific questions about what was found during the examination.  If the procedure report does not answer your questions, please call your gastroenterologist to clarify.  If you requested that your care partner not be given the details of your procedure findings, then the procedure report has been included in a sealed envelope for you to review at your convenience later.  YOU SHOULD EXPECT: Some feelings of bloating in the abdomen. Passage of more gas than usual.  Walking can help get rid of the air that was put into your GI tract during the procedure and reduce the bloating. If you had a lower endoscopy (such as a colonoscopy or flexible sigmoidoscopy) you may notice spotting of blood in your stool or on the toilet paper. If you underwent a bowel prep for your procedure, you may not have a normal bowel movement for a few days.  Please Note:  You might notice some irritation and congestion in your nose or some drainage.  This is from the oxygen used during your procedure.  There is no need for concern and it should clear up in a day or so.  SYMPTOMS TO REPORT IMMEDIATELY:   Following lower endoscopy (colonoscopy or flexible sigmoidoscopy):  Excessive amounts of blood in the stool  Significant tenderness or worsening of abdominal pains  Swelling of the abdomen that is new, acute  Fever of 100F or higher  For urgent or emergent issues, a gastroenterologist can be reached at any hour by calling [phone]. Do not use  MyChart messaging for urgent concerns.    DIET:  We do recommend a small meal at first, but then you may proceed to your regular diet.  Drink plenty of fluids but you should avoid alcoholic beverages for 24 hours.  ACTIVITY:  You should plan to take it easy for the rest of today and you should NOT DRIVE or use heavy machinery until tomorrow (because of the sedation medicines used during the test).    FOLLOW UP: Our staff will call the number listed on your records 48-72 hours following your procedure to check on you and address any questions or concerns that you may have regarding the information given to you following your procedure. If we do not reach you, we will leave a message.  We will attempt to reach you two times.  During this call, we will ask if you have developed any symptoms of COVID 19. If you develop any symptoms (ie: fever, flu-like symptoms, shortness of breath, cough etc.) before then, please call [phone].  If you test positive for Covid 19 in the 2 weeks post procedure, please call and report this information to us.    If any biopsies were taken you will be contacted by phone or by letter within the next 1-3 weeks.  Please call us at [phone] if you have not heard about the biopsies in 3 weeks.    SIGNATURES/CONFIDENTIALITY: You and/or your care partner have signed paperwork which will be   entered into your electronic medical record.  These signatures attest to the fact that that the information above on your After Visit Summary has been reviewed and is understood.  Full responsibility of the confidentiality of this discharge information lies with you and/or your care-partner. 

## 2019-08-26 NOTE — Progress Notes (Signed)
Called to room to assist during endoscopic procedure.  Patient ID and intended procedure confirmed with present staff. Received instructions for my participation in the procedure from the performing physician.  

## 2019-08-26 NOTE — Op Note (Signed)
[place] Patient Name: Angela Hensley Procedure Date: 08/26/2019 9:31 AM MRN: [id number] Endoscopist: Jerene Bears , MD Age: 59 Referring MD:  Date of Birth: 04-18-1961 Gender: Female Account #: [id number] Procedure:                Colonoscopy Indications:              High risk colon cancer surveillance: Personal                            history of hyperplastic right-sided colonic polyps,                            Last colonoscopy: January 2014 Medicines:                Monitored Anesthesia Care Procedure:                Pre-Anesthesia Assessment:                           - Prior to the procedure, a History and Physical                            was performed, and patient medications and                            allergies were reviewed. The patient's tolerance of                            previous anesthesia was also reviewed. The risks                            and benefits of the procedure and the sedation                            options and risks were discussed with the patient.                            All questions were answered, and informed consent                            was obtained. Prior Anticoagulants: The patient has                            taken no previous anticoagulant or antiplatelet                            agents. ASA Grade Assessment: II - A patient with                            mild systemic disease. After reviewing the risks                            and benefits, the patient was deemed in  satisfactory condition to undergo the procedure.                           After obtaining informed consent, the colonoscope                            was passed under direct vision. Throughout the                            procedure, the patient's blood pressure, pulse, and                            oxygen saturations were monitored continuously. The                            Colonoscope was introduced  through the anus and                            advanced to the cecum, identified by appendiceal                            orifice and ileocecal valve. The colonoscopy was                            performed without difficulty. The patient tolerated                            the procedure well. The quality of the bowel                            preparation was good. Scope In: 9:37:35 AM Scope Out: 9:57:56 AM Scope Withdrawal Time: 0 hours 15 minutes 5 seconds  Total Procedure Duration: 0 hours 20 minutes 21 seconds  Findings:                 The digital rectal exam was normal.                           A 5 mm polyp was found in the proximal transverse                            colon. The polyp was sessile. The polyp was removed                            with a cold snare. Resection was complete, but the                            polyp tissue was not retrieved.                           Two sessile polyps were found in the descending                            colon. The polyps were 3 to 6 mm  in size. These                            polyps were removed with a cold snare. Resection                            and retrieval were complete.                           Multiple small-mouthed diverticula were found in                            the sigmoid colon.                           The retroflexed view of the distal rectum and anal                            verge was normal and showed no anal or rectal                            abnormalities. Complications:            No immediate complications. Estimated Blood Loss:     Estimated blood loss was minimal. Impression:               - One 5 mm polyp in the proximal transverse colon,                            removed with a cold snare. Complete resection.                            Polyp tissue not retrieved.                           - Two 3 to 6 mm polyps in the descending colon,                            removed with a cold  snare. Resected and retrieved.                           - Diverticulosis in the sigmoid colon.                           - The distal rectum and anal verge are normal on                            retroflexion view. Recommendation:           - Patient has a contact number available for                            emergencies. The signs and symptoms of potential                            delayed complications were  discussed with the                            patient. Return to normal activities tomorrow.                            Written discharge instructions were provided to the                            patient.                           - Resume previous diet.                           - Continue present medications.                           - Await pathology results.                           - Repeat colonoscopy is recommended. The                            colonoscopy date will be determined after pathology                            results from today's exam become available for                            review. Jerene Bears, MD 08/26/2019 10:00:28 AM This report has been signed electronically.

## 2019-08-26 NOTE — Progress Notes (Signed)
LS- temp Dt - VS   Pt's states no medical or surgical changes since previsit or office visit. Maw  Pt reported she did not drink approximately 12 oz of her Golytely pre d/t nausea.  She felt if she drank the rest of the prep that she would vomite.  No vomiting noted.  Pt reported clear liquid last results from prep. maw

## 2019-08-26 NOTE — Progress Notes (Signed)
Report to PACU, RN, vss, BBS= Clear.  

## 2019-08-30 ENCOUNTER — Telehealth: Payer: Self-pay | Admitting: *Deleted

## 2019-08-30 ENCOUNTER — Encounter: Payer: Self-pay | Admitting: Internal Medicine

## 2019-08-30 NOTE — Telephone Encounter (Signed)
Second attempt, left VM.  

## 2019-08-30 NOTE — Telephone Encounter (Signed)
  Follow up Call-  Call back number 08/26/2019  Post procedure Call Back phone  # [phone] cell  Permission to leave phone message Yes  Some recent data might be hidden     Patient questions:  Message left to call us if necessary.

## 2020-01-07 ENCOUNTER — Other Ambulatory Visit: Payer: Self-pay

## 2020-01-07 ENCOUNTER — Encounter: Payer: Self-pay | Admitting: Emergency Medicine

## 2020-01-07 ENCOUNTER — Ambulatory Visit
Admission: EM | Admit: 2020-01-07 | Discharge: 2020-01-07 | Disposition: A | Discharge status: Home / Self Care | Payer: Self-pay | Attending: Emergency Medicine | Admitting: Emergency Medicine

## 2020-01-07 DIAGNOSIS — N390 Urinary tract infection, site not specified: Secondary | ICD-10-CM | POA: Insufficient documentation

## 2020-01-07 LAB — URINALYSIS, COMPLETE (UACMP) WITH MICROSCOPIC
Bilirubin Urine: NEGATIVE
Glucose, UA: NEGATIVE mg/dL
Hgb urine dipstick: NEGATIVE
Ketones, ur: NEGATIVE mg/dL
Nitrite: NEGATIVE
Protein, ur: NEGATIVE mg/dL
Specific Gravity, Urine: 1.025 (ref 1.005–1.030)
pH: 6 (ref 5.0–8.0)

## 2020-01-07 MED ORDER — FLUCONAZOLE 200 MG PO TABS: ORAL_TABLET | ORAL | 0 refills | Status: DC

## 2020-01-07 MED ORDER — NITROFURANTOIN MONOHYD MACRO 100 MG PO CAPS: 100.0000 mg | ORAL_CAPSULE | Freq: Two times a day (BID) | ORAL | 0 refills | Status: AC

## 2020-01-07 NOTE — ED Triage Notes (Signed)
Patient c/o burning when urinating and urinary frequency that started on Thursday.

## 2020-01-07 NOTE — Discharge Instructions (Addendum)
Your urine is concerning for mild UTI so I have sent additional antibiotics. I have collected a culture as well to further assess.  There is some yeast in your urine as well, which may also be contributing. Take fluconazole at completion of antibiotics.  Continue to follow with gynecology as they may have additional treatment options for you.

## 2020-01-07 NOTE — ED Provider Notes (Signed)
[facility]    CSN: [id number] Arrival date & time: 01/07/20  1331      History   Chief Complaint Chief Complaint  Patient presents with  . Dysuria    HPI Angela Hensley is a 59 y.o. female.   Angela Hensley presents with complaints of burning with urination, as well as urgency. Has tried taking motrin and increasing fluids which have helped. Was given bactrim for 7 days, started on 8/30,  for her sinus symptoms and had similar urinary symptoms. Urinary symptoms had resolved with this. Urinary symptoms restarted 9/15. 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 Vulva has been more irritated recently. Saw gynecology last in June of this year. Historically had used clobetasol but ran out. She has been applying OTC hydrocortisone. She has been riding bikes more recently as well. No back pain, no fevers. No nausea or vomiting.    ROS per HPI, negative if not otherwise mentioned.         Past Medical History:  Diagnosis Date  . Anxiety    past   . Hyperlipidemia     There are no problems to display for this patient.   Past Surgical History:  Procedure Laterality Date  . COLONOSCOPY    . POLYPECTOMY      OB History   No obstetric history on file.      Home Medications    Prior to Admission medications   Medication Sig Start Date End Date Taking? Authorizing Provider  Ascorbic Acid (VITAMIN C) 1000 MG tablet Take 1,000 mg by mouth daily.   Yes [provider]  cholecalciferol (VITAMIN D3) 25 MCG (1000 UNIT) tablet Take 1,000 Units by mouth daily.   Yes [provider]  Multiple Vitamin (MULTIVITAMIN) tablet Take 1 tablet by mouth daily.   Yes [provider]  Omega-3 Fatty Acids (FISH OIL) 1000 MG CAPS Take by mouth.   Yes [provider]  albuterol (PROVENTIL HFA;VENTOLIN HFA) 108 (90 Base) MCG/ACT inhaler Inhale 2 puffs into the lungs every 4 (four) hours as needed for wheezing. Patient not taking: Reported on  08/19/2019 01/30/18   Marylene Land, NP  Calcium-Magnesium-Vitamin D (CALCIUM MAGNESIUM PO) Take by mouth.    [provider]  fluconazole (DIFLUCAN) 200 MG tablet At completion of antibiotics 01/07/20   Zigmund Gottron, NP  fluticasone (FLONASE) 50 MCG/ACT nasal spray Place 2 sprays into both nostrils daily. Patient not taking: Reported on 08/19/2019 02/14/18   Melynda Ripple, MD  nitrofurantoin, macrocrystal-monohydrate, (MACROBID) 100 MG capsule Take 1 capsule (100 mg total) by mouth 2 (two) times daily for 5 days. 01/07/20 01/12/20  Zigmund Gottron, NP  Spacer/Aero-Holding Chambers (AEROCHAMBER PLUS) inhaler Use as instructed Patient not taking: Reported on 08/26/2019 02/14/18   Melynda Ripple, MD    Family History Family History  Problem Relation Age of Onset  . Diabetes Mother   . Diabetes Father   . Breast cancer Maternal Grandmother   . Colon cancer Neg Hx   . Rectal cancer Neg Hx   . Stomach cancer Neg Hx   . Colon polyps Neg Hx   . Esophageal cancer Neg Hx     Social History Social History   Tobacco Use  . Smoking status: Never Smoker  . Smokeless tobacco: Never Used  Vaping Use  . Vaping Use: Never used  Substance Use Topics  . Alcohol use: Yes    Comment: 2-4 glasses of wine/week  . Drug use: No  Allergies   Penicillins   Review of Systems Review of Systems   Physical Exam Triage Vital Signs ED Triage Vitals  Enc Vitals Group     BP 01/07/20 1346 (!) 144/83     Pulse Rate 01/07/20 1346 73     Resp 01/07/20 1346 14     Temp 01/07/20 1346 98 F (36.7 C)     Temp Source 01/07/20 1346 Oral     SpO2 01/07/20 1346 100 %     Weight 01/07/20 1341 158 lb (71.7 kg)     Height 01/07/20 1341 5' 5.5" (1.664 m)     Head Circumference --      Peak Flow --      Pain Score 01/07/20 1341 1     Pain Loc --      Pain Edu? --      Excl. in [place]? --    No data found.  Updated Vital Signs BP (!) 144/83 (BP Location: Right Arm)   Pulse 73   Temp  98 F (36.7 C) (Oral)   Resp 14   Ht 5' 5.5" (1.664 m)   Wt 158 lb (71.7 kg)   SpO2 100%   BMI 25.89 kg/m   Physical Exam Constitutional:      General: She is not in acute distress.    Appearance: She is well-developed.  Cardiovascular:     Rate and Rhythm: Normal rate.  Pulmonary:     Effort: Pulmonary effort is normal.  Abdominal:     Tenderness: There is no right CVA tenderness or left CVA tenderness.  Skin:    General: Skin is warm and dry.  Neurological:     Mental Status: She is alert and oriented to person, place, and time.      UC Treatments / Results  Labs (all labs ordered are listed, but only abnormal results are displayed) Labs Reviewed  URINALYSIS, COMPLETE (UACMP) WITH MICROSCOPIC - Abnormal; Notable for the following components:      Result Value   APPearance HAZY (*)    Leukocytes,Ua SMALL (*)    Bacteria, UA FEW (*)    All other components within normal limits  URINE CULTURE    EKG   Radiology No results found.  Procedures Procedures (including critical care time)  Medications Ordered in UC Medications - No data to display  Initial Impression / Assessment and Plan / UC Course  I have reviewed the triage vital signs and the nursing notes.  Pertinent labs & imaging results that were available during my care of the patient were reviewed by me and considered in my medical decision making (see chart for details).    Small leuks and few bacteria, also with budding yeast. Culture obtained and pending with macrobid initiated pending results. Diflucan provided as well for use at completion. Follow up with gyne recommended. Return precautions provided. Patient verbalized understanding and agreeable to plan.   Final Clinical Impressions(s) / UC Diagnoses   Final diagnoses:  Lower urinary tract infectious disease     Discharge Instructions     Your urine is concerning for mild UTI so I have sent additional antibiotics. I have collected a  culture as well to further assess.  There is some yeast in your urine as well, which may also be contributing. Take fluconazole at completion of antibiotics.  Continue to follow with gynecology as they may have additional treatment options for you.     ED Prescriptions    Medication Sig Dispense Auth.  Provider   fluconazole (DIFLUCAN) 200 MG tablet At completion of antibiotics 1 tablet Augusto Gamble B, NP   nitrofurantoin, macrocrystal-monohydrate, (MACROBID) 100 MG capsule Take 1 capsule (100 mg total) by mouth 2 (two) times daily for 5 days. 10 capsule Zigmund Gottron, NP     PDMP not reviewed this encounter.   Zigmund Gottron, NP 01/07/20 1428

## 2020-01-09 LAB — URINE CULTURE

## 2020-01-24 ENCOUNTER — Other Ambulatory Visit: Payer: Self-pay

## 2020-01-24 ENCOUNTER — Other Ambulatory Visit: Payer: Self-pay | Admitting: Internal Medicine

## 2020-01-24 ENCOUNTER — Ambulatory Visit
Admission: RE | Admit: 2020-01-24 | Discharge: 2020-01-24 | Disposition: A | Discharge status: Home / Self Care | Payer: Self-pay | Source: Ambulatory Visit | Attending: Internal Medicine | Admitting: Internal Medicine

## 2020-01-24 DIAGNOSIS — R319 Hematuria, unspecified: Secondary | ICD-10-CM

## 2020-02-03 ENCOUNTER — Other Ambulatory Visit: Payer: Self-pay

## 2020-02-17 ENCOUNTER — Other Ambulatory Visit: Payer: Self-pay

## 2021-02-07 NOTE — Progress Notes (Deleted)
[place] Phone: [phone] Subjective:    I'm seeing this patient by the request  of:  Shon Baton, MD  CC:   [id number]  Altheia Shafran is a 60 y.o. female coming in with complaint of L shoulder pain. Patient states   Onset-  Location Duration-  Character- Aggravating factors- Reliving factors-  Therapies tried-  Severity-     Past Medical History:  Diagnosis Date   Anxiety    past    Hyperlipidemia    Past Surgical History:  Procedure Laterality Date   COLONOSCOPY     POLYPECTOMY     Social History   Socioeconomic History   Marital status: Married    Spouse name: Not on file   Number of children: Not on file   Years of education: Not on file   Highest education level: Not on file  Occupational History   Not on file  Tobacco Use   Smoking status: Never   Smokeless tobacco: Never  Vaping Use   Vaping Use: Never used  Substance and Sexual Activity   Alcohol use: Yes    Comment: 2-4 glasses of wine/week   Drug use: No   Sexual activity: Yes    Birth control/protection: Post-menopausal  Other Topics Concern   Not on file  Social History Narrative   Not on file   Social Determinants of Health   Financial Resource Strain: Not on file  Food Insecurity: Not on file  Transportation Needs: Not on file  Physical Activity: Not on file  Stress: Not on file  Social Connections: Not on file   Allergies  Allergen Reactions   Penicillins Rash   Family History  Problem Relation Age of Onset   Diabetes Mother    Diabetes Father    Breast cancer Maternal Grandmother    Colon cancer Neg Hx    Rectal cancer Neg Hx    Stomach cancer Neg Hx    Colon polyps Neg Hx    Esophageal cancer Neg Hx       Current Outpatient Medications (Respiratory):    albuterol (PROVENTIL HFA;VENTOLIN HFA) 108 (90 Base) MCG/ACT inhaler, Inhale 2 puffs into the lungs every 4 (four) hours as needed  for wheezing. (Patient not taking: Reported on 08/19/2019)   fluticasone (FLONASE) 50 MCG/ACT nasal spray, Place 2 sprays into both nostrils daily. (Patient not taking: Reported on 08/19/2019)    Current Outpatient Medications (Other):    Ascorbic Acid (VITAMIN C) 1000 MG tablet, Take 1,000 mg by mouth daily.   Calcium-Magnesium-Vitamin D (CALCIUM MAGNESIUM PO), Take by mouth.   cholecalciferol (VITAMIN D3) 25 MCG (1000 UNIT) tablet, Take 1,000 Units by mouth daily.   fluconazole (DIFLUCAN) 200 MG tablet, At completion of antibiotics   Multiple Vitamin (MULTIVITAMIN) tablet, Take 1 tablet by mouth daily.   Omega-3 Fatty Acids (FISH OIL) 1000 MG CAPS, Take by mouth.   Spacer/Aero-Holding Chambers (AEROCHAMBER PLUS) inhaler, Use as instructed (Patient not taking: Reported on 08/26/2019)   Reviewed prior external information including notes and imaging from  primary care provider As well as notes that were available from care everywhere and other healthcare systems.  Past medical history, social, surgical and family history all reviewed in electronic medical record.  No pertanent information unless stated regarding to the chief complaint.   Review of Systems:  No headache, visual changes, nausea, vomiting, diarrhea, constipation, dizziness, abdominal pain, skin rash, fevers, chills, night sweats, weight loss, swollen  lymph nodes, body aches, joint swelling, chest pain, shortness of breath, mood changes. POSITIVE muscle aches  Objective  There were no vitals taken for this visit.   General: No apparent distress alert and oriented x3 mood and affect normal, dressed appropriately.  HEENT: Pupils equal, extraocular movements intact  Respiratory: Patient's speak in full sentences and does not appear short of breath  Cardiovascular: No lower extremity edema, non tender, no erythema  Gait normal with good balance and coordination.  MSK:  Non tender with full range of motion and good stability and  symmetric strength and tone of shoulders, elbows, wrist, hip, knee and ankles bilaterally.     Impression and Recommendations:     The above documentation has been reviewed and is accurate and complete [name]

## 2021-02-08 ENCOUNTER — Ambulatory Visit: Payer: Self-pay | Admitting: Family Medicine

## 2021-03-21 NOTE — Progress Notes (Deleted)
[place] Phone: [phone] Subjective:    I'm seeing this patient by the request  of:  Shon Baton, MD  CC:   [id number]  Angela Hensley is a 60 y.o. female coming in with complaint of L shoulder pain. Patient states        Past Medical History:  Diagnosis Date   Anxiety    past    Hyperlipidemia    Past Surgical History:  Procedure Laterality Date   COLONOSCOPY     POLYPECTOMY     Social History   Socioeconomic History   Marital status: Married    Spouse name: Not on file   Number of children: Not on file   Years of education: Not on file   Highest education level: Not on file  Occupational History   Not on file  Tobacco Use   Smoking status: Never   Smokeless tobacco: Never  Vaping Use   Vaping Use: Never used  Substance and Sexual Activity   Alcohol use: Yes    Comment: 2-4 glasses of wine/week   Drug use: No   Sexual activity: Yes    Birth control/protection: Post-menopausal  Other Topics Concern   Not on file  Social History Narrative   Not on file   Social Determinants of Health   Financial Resource Strain: Not on file  Food Insecurity: Not on file  Transportation Needs: Not on file  Physical Activity: Not on file  Stress: Not on file  Social Connections: Not on file   Allergies  Allergen Reactions   Penicillins Rash   Family History  Problem Relation Age of Onset   Diabetes Mother    Diabetes Father    Breast cancer Maternal Grandmother    Colon cancer Neg Hx    Rectal cancer Neg Hx    Stomach cancer Neg Hx    Colon polyps Neg Hx    Esophageal cancer Neg Hx       Current Outpatient Medications (Respiratory):    albuterol (PROVENTIL HFA;VENTOLIN HFA) 108 (90 Base) MCG/ACT inhaler, Inhale 2 puffs into the lungs every 4 (four) hours as needed for wheezing. (Patient not taking: Reported on 08/19/2019)   fluticasone (FLONASE) 50 MCG/ACT nasal spray, Place 2  sprays into both nostrils daily. (Patient not taking: Reported on 08/19/2019)    Current Outpatient Medications (Other):    Ascorbic Acid (VITAMIN C) 1000 MG tablet, Take 1,000 mg by mouth daily.   Calcium-Magnesium-Vitamin D (CALCIUM MAGNESIUM PO), Take by mouth.   cholecalciferol (VITAMIN D3) 25 MCG (1000 UNIT) tablet, Take 1,000 Units by mouth daily.   fluconazole (DIFLUCAN) 200 MG tablet, At completion of antibiotics   Multiple Vitamin (MULTIVITAMIN) tablet, Take 1 tablet by mouth daily.   Omega-3 Fatty Acids (FISH OIL) 1000 MG CAPS, Take by mouth.   Spacer/Aero-Holding Chambers (AEROCHAMBER PLUS) inhaler, Use as instructed (Patient not taking: Reported on 08/26/2019)   Reviewed prior external information including notes and imaging from  primary care provider As well as notes that were available from care everywhere and other healthcare systems.  Past medical history, social, surgical and family history all reviewed in electronic medical record.  No pertanent information unless stated regarding to the chief complaint.   Review of Systems:  No headache, visual changes, nausea, vomiting, diarrhea, constipation, dizziness, abdominal pain, skin rash, fevers, chills, night sweats, weight loss, swollen lymph nodes, body aches, joint swelling, chest pain, shortness of breath, mood changes. POSITIVE  muscle aches  Objective  There were no vitals taken for this visit.   General: No apparent distress alert and oriented x3 mood and affect normal, dressed appropriately.  HEENT: Pupils equal, extraocular movements intact  Respiratory: Patient's speak in full sentences and does not appear short of breath  Cardiovascular: No lower extremity edema, non tender, no erythema  Gait normal with good balance and coordination.  MSK:  Non tender with full range of motion and good stability and symmetric strength and tone of shoulders, elbows, wrist, hip, knee and ankles bilaterally.     Impression and  Recommendations:     The above documentation has been reviewed and is accurate and complete [name]

## 2021-03-22 ENCOUNTER — Ambulatory Visit: Payer: Self-pay | Admitting: Family Medicine

## 2021-06-12 ENCOUNTER — Other Ambulatory Visit: Payer: Self-pay | Admitting: Internal Medicine

## 2021-06-12 DIAGNOSIS — E785 Hyperlipidemia, unspecified: Secondary | ICD-10-CM

## 2022-03-24 ENCOUNTER — Encounter: Payer: Self-pay | Admitting: Emergency Medicine

## 2022-03-24 ENCOUNTER — Ambulatory Visit
Admission: EM | Admit: 2022-03-24 | Discharge: 2022-03-24 | Disposition: A | Discharge status: Home / Self Care | Payer: Self-pay | Attending: Internal Medicine | Admitting: Internal Medicine

## 2022-03-24 DIAGNOSIS — N390 Urinary tract infection, site not specified: Secondary | ICD-10-CM

## 2022-03-24 LAB — URINALYSIS, MICROSCOPIC (REFLEX): WBC, UA: >50 WBC/hpf (ref 0–5)

## 2022-03-24 LAB — URINALYSIS, ROUTINE W REFLEX MICROSCOPIC
Bilirubin Urine: NEGATIVE
Glucose, UA: NEGATIVE mg/dL
Nitrite: NEGATIVE
Protein, ur: NEGATIVE mg/dL
Specific Gravity, Urine: 1.015 (ref 1.005–1.030)
pH: 5.5 (ref 5.0–8.0)

## 2022-03-24 MED ORDER — NITROFURANTOIN MONOHYD MACRO 100 MG PO CAPS: 100.0000 mg | ORAL_CAPSULE | Freq: Two times a day (BID) | ORAL | 0 refills | Status: DC

## 2022-03-24 NOTE — Discharge Instructions (Addendum)
Your urine shows sign of infection, so I have sent an antibiotic and I have ordered a urine culture. We will call you if we need to make changes to your medication. You may sign up for mychart to check on your results.

## 2022-03-24 NOTE — ED Triage Notes (Signed)
Pt c/o dysuria, urinary frequency and urgency. Started about a week ago. She states it has been intermittent because she has been pushing lots of fluids.

## 2022-03-25 ENCOUNTER — Ambulatory Visit: Payer: Self-pay

## 2022-03-25 NOTE — ED Provider Notes (Addendum)
[facility]     CSN: [id number] Arrival date & time: 03/24/22  1730      History   Chief Complaint Chief Complaint  Patient presents with   Dysuria    HPI Angela Hensley is a 61 y.o. female who presents with onset of dysuria, frequency and urgency which started one week ago, but she has been taking cranberry pills and has helped off and on, but today the dysuria got worse and saw blood in the tissue when she wiped.  Her last UTI was 1 year ago. Denies fever or flank pain.    Past Medical History:  Diagnosis Date   Anxiety    past    Hyperlipidemia     There are no problems to display for this patient.   Past Surgical History:  Procedure Laterality Date   COLONOSCOPY     POLYPECTOMY      OB History   No obstetric history on file.      Home Medications    Prior to Admission medications   Medication Sig Start Date End Date Taking? Authorizing Provider  Ascorbic Acid (VITAMIN C) 1000 MG tablet Take 1,000 mg by mouth daily.   Yes [provider]  cholecalciferol (VITAMIN D3) 25 MCG (1000 UNIT) tablet Take 1,000 Units by mouth daily.   Yes [provider]  Multiple Vitamin (MULTIVITAMIN) tablet Take 1 tablet by mouth daily.   Yes [provider]  nitrofurantoin, macrocrystal-monohydrate, (MACROBID) 100 MG capsule Take 1 capsule (100 mg total) by mouth 2 (two) times daily. 03/24/22  Yes Rodriguez-Southworth, Sunday Spillers, PA-C  Omega-3 Fatty Acids (FISH OIL) 1000 MG CAPS Take by mouth.   Yes [provider]  Red Yeast Rice Extract (RED YEAST RICE PO) Take by mouth.   Yes [provider]    Family History Family History  Problem Relation Age of Onset   Diabetes Mother    Diabetes Father    Breast cancer Maternal Grandmother    Colon cancer Neg Hx    Rectal cancer Neg Hx    Stomach cancer Neg Hx    Colon polyps Neg Hx    Esophageal cancer Neg Hx     Social History Social History   Tobacco Use   Smoking status:  Never   Smokeless tobacco: Never  Vaping Use   Vaping Use: Never used  Substance Use Topics   Alcohol use: Yes    Comment: 2-4 glasses of wine/week   Drug use: No     Allergies   Penicillins   Review of Systems Review of Systems  Constitutional:  Negative for fever.  Gastrointestinal:  Negative for abdominal pain and constipation.  Genitourinary:  Positive for dysuria, frequency and urgency. Negative for flank pain.     Physical Exam Triage Vital Signs ED Triage Vitals  Enc Vitals Group     BP 03/24/22 1851 118/76     Pulse Rate 03/24/22 1851 65     Resp 03/24/22 1851 16     Temp 03/24/22 1851 98.4 F (36.9 C)     Temp Source 03/24/22 1851 Oral     SpO2 03/24/22 1851 97 %     Weight 03/24/22 1849 158 lb 1.1 oz (71.7 kg)     Height 03/24/22 1849 5' 5.5" (1.664 m)     Head Circumference --      Peak Flow --      Pain Score 03/24/22 1848 4     Pain Loc --  Pain Edu? --      Excl. in [place]? --    No data found.  Updated Vital Signs BP 118/76 (BP Location: Left Arm)   Pulse 65   Temp 98.4 F (36.9 C) (Oral)   Resp 16   Ht 5' 5.5" (1.664 m)   Wt 158 lb 1.1 oz (71.7 kg)   SpO2 97%   BMI 25.90 kg/m   Visual Acuity Right Eye Distance:   Left Eye Distance:   Bilateral Distance:    Right Eye Near:   Left Eye Near:    Bilateral Near:      Physical Exam Vitals and nursing note reviewed.  Constitutional:      General: She is not in acute distress.    Appearance: She is not toxic-appearing.  HENT:     Head: Normocephalic.     Right Ear: External ear normal.     Left Ear: External ear normal.  Eyes:     General: No scleral icterus.    Conjunctiva/sclera: Conjunctivae normal.  Pulmonary:     Effort: Pulmonary effort is normal.  Abdominal:     General: Bowel sounds are normal.     Palpations: Abdomen is soft. There is no mass.     Tenderness: There is no guarding or rebound.     Comments: - CVA tenderness   Musculoskeletal:        General: Normal  range of motion.     Cervical back: Neck supple.    Skin:    General: Skin is warm and dry.     Findings: No rash.  Neurological:     Mental Status: She is alert and oriented to person, place, and time.     Gait: Gait normal.  Psychiatric:        Mood and Affect: Mood normal.        Behavior: Behavior normal.        Thought Content: Thought content normal.        Judgment: Judgment normal.   UC Treatments / Results  Labs (all labs ordered are listed, but only abnormal results are displayed) Labs Reviewed  URINALYSIS, ROUTINE W REFLEX MICROSCOPIC - Abnormal; Notable for the following components:      Result Value   APPearance CLOUDY (*)    Hgb urine dipstick MODERATE (*)    Ketones, ur TRACE (*)    Leukocytes,Ua SMALL (*)    All other components within normal limits  URINALYSIS, MICROSCOPIC (REFLEX) - Abnormal; Notable for the following components:   Bacteria, UA FEW (*)    All other components within normal limits  URINE CULTURE    EKG   Radiology No results found.  Procedures Procedures (including critical care time)  Medications Ordered in UC Medications - No data to display  Initial Impression / Assessment and Plan / UC Course  I have reviewed the triage vital signs and the nursing notes.  Pertinent labs  results that were available during my care of the patient were reviewed by me and considered in my medical decision making (see chart for details).  UTI  I sent the urine for a culture and we will call her if we need to make changes to her medication I placed her on Macrobid and pyridium as noted.    Final Clinical Impressions(s) / UC Diagnoses  UTI Final diagnoses:  Lower urinary tract infectious disease     Discharge Instructions      Your urine shows sign of infection, so I  have sent an antibiotic and I have ordered a urine culture. We will call you if we need to make changes to your medication. You may sign up for mychart to check on your  results.     ED Prescriptions     Medication Sig Dispense Auth. Provider   nitrofurantoin, macrocrystal-monohydrate, (MACROBID) 100 MG capsule Take 1 capsule (100 mg total) by mouth 2 (two) times daily. 10 capsule Rodriguez-Southworth, Sunday Spillers, PA-C      PDMP not reviewed this encounter.   Shelby Mattocks, PA-C 03/25/22 1434    Rodriguez-Southworth, [place], [place] 03/25/22 1436

## 2022-03-26 LAB — URINE CULTURE: Culture: NO GROWTH

## 2022-05-10 IMAGING — CT CT ABD-PELV W/O CM
1 of 2 series · 14 of 32 positions shown, 19 images · non-contrast
Comparison: None.

CLINICAL DATA: Microhematuria. Dysuria for several days. UTI
symptoms. Left-sided pressure for 2 weeks.

EXAM:
CT ABDOMEN AND PELVIS WITHOUT CONTRAST
TECHNIQUE: Multidetector CT imaging of the abdomen and pelvis was performed
following the standard protocol without IV contrast.

[Series 2: renal standard/full · axial · 0.79mm/px · z∈[-477,-102]mm · 14 of 85 slices shown, 19 images]
[im 5/85  soft-tissue]
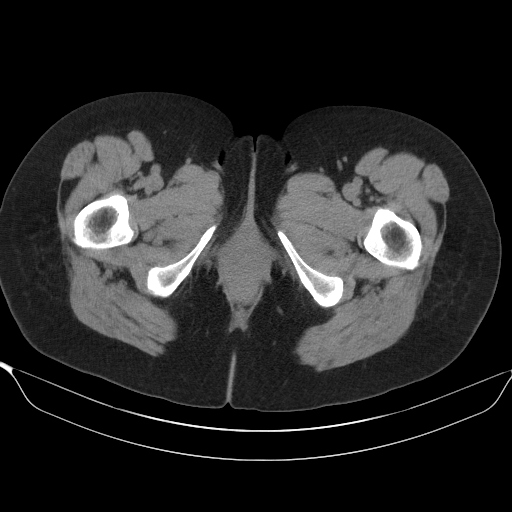
[im 5/85  bone]
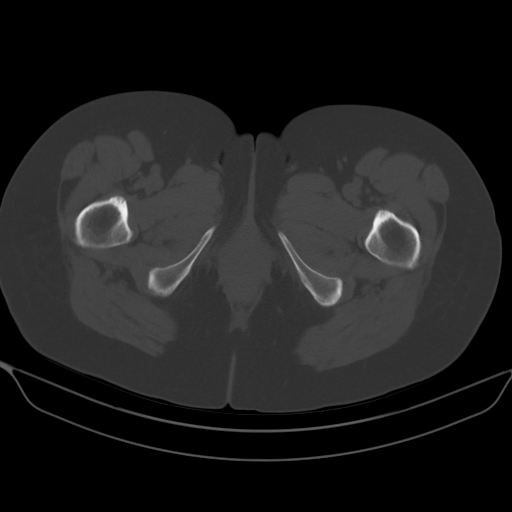
[im 14/85  soft-tissue]
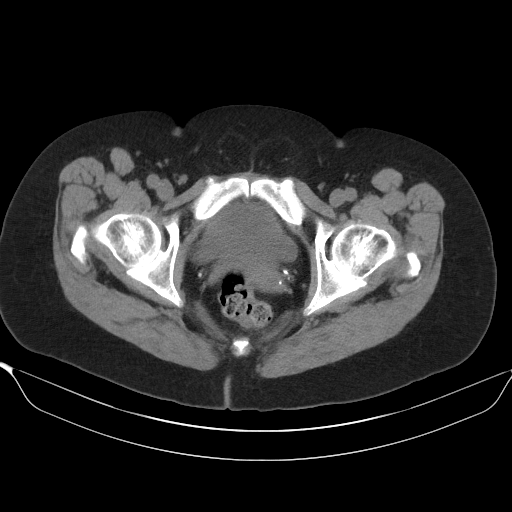
[im 18/85  soft-tissue]
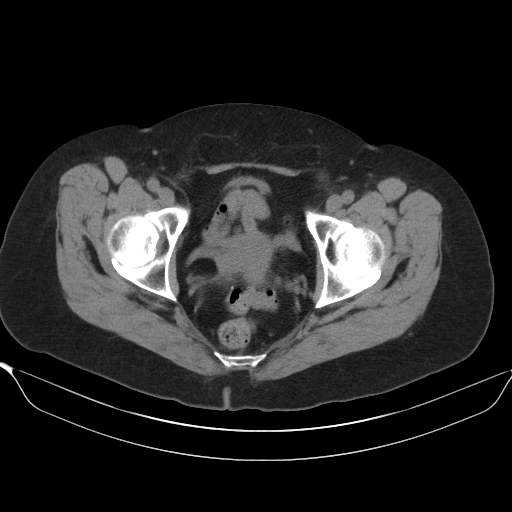
[im 23/85  soft-tissue]
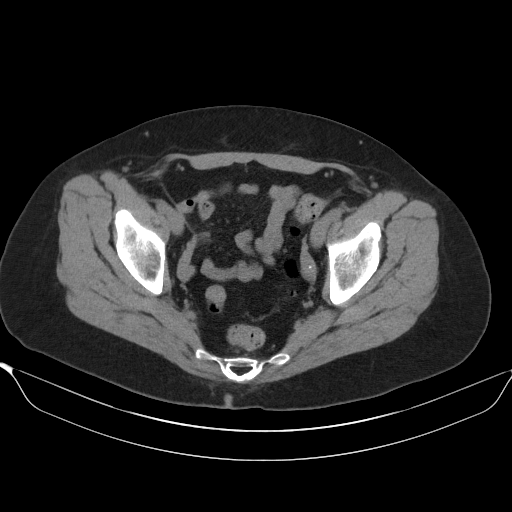
[im 31/85  soft-tissue]
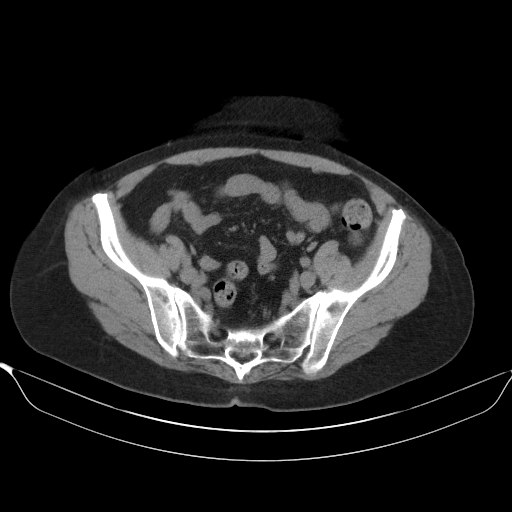
[im 36/85  soft-tissue]
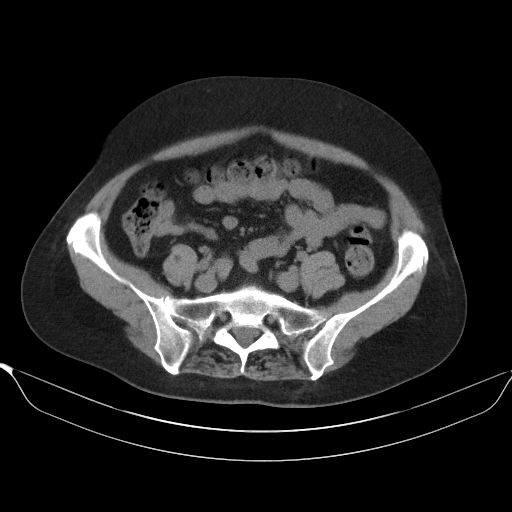
[im 45/85  soft-tissue]
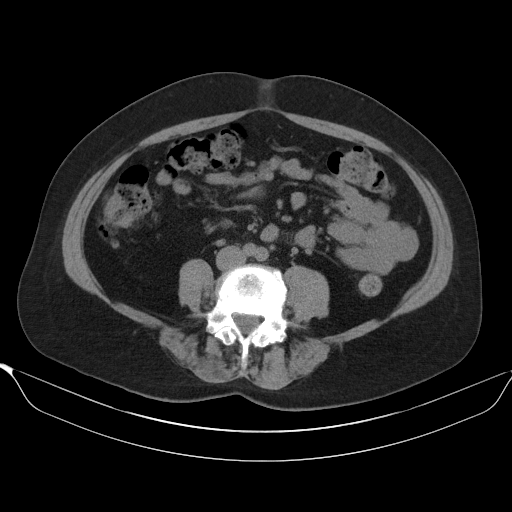
[im 49/85  soft-tissue]
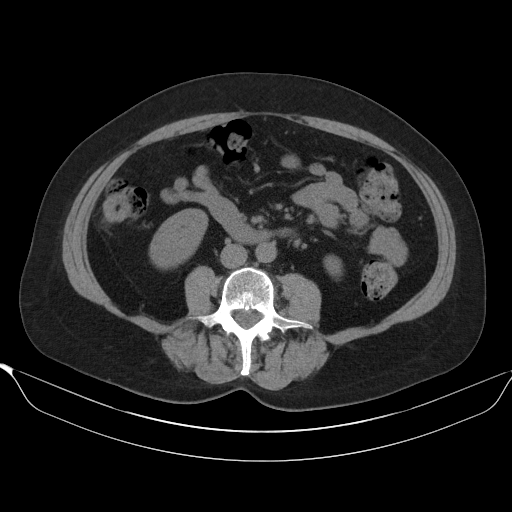
[im 54/85  soft-tissue]
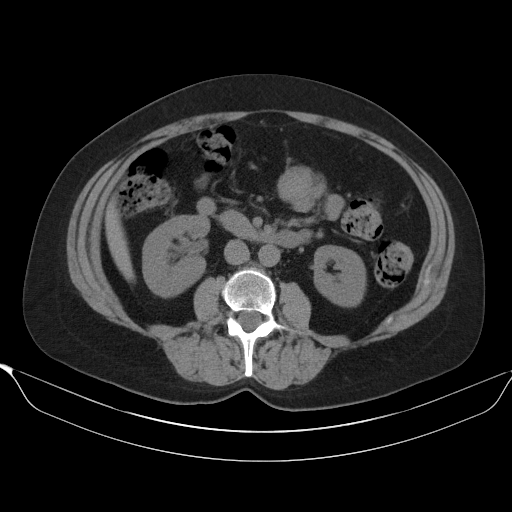
[im 54/85  bone]
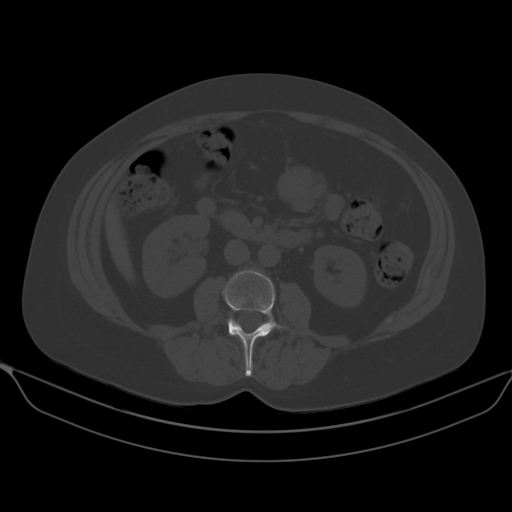
[im 62/85  soft-tissue]
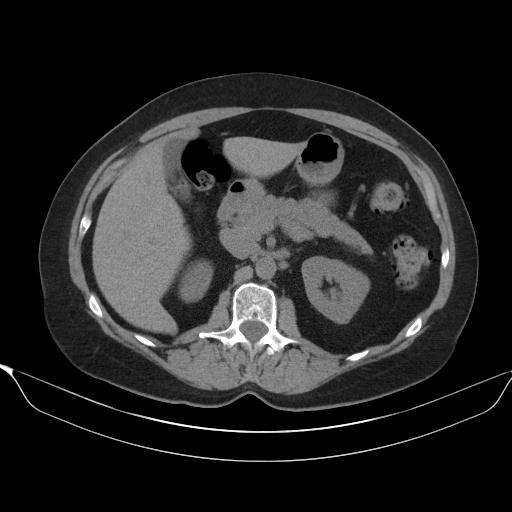
[im 67/85  soft-tissue]
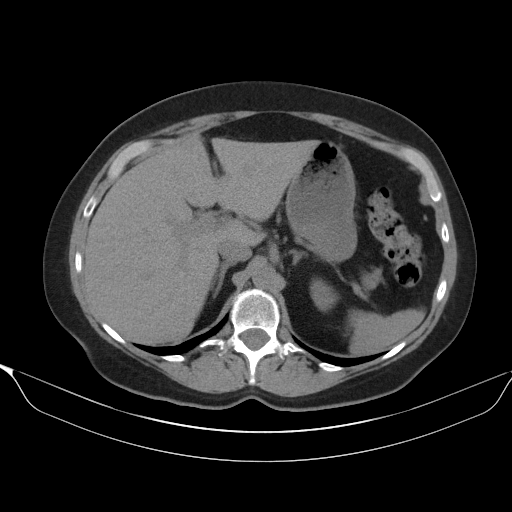
[im 67/85  lung]
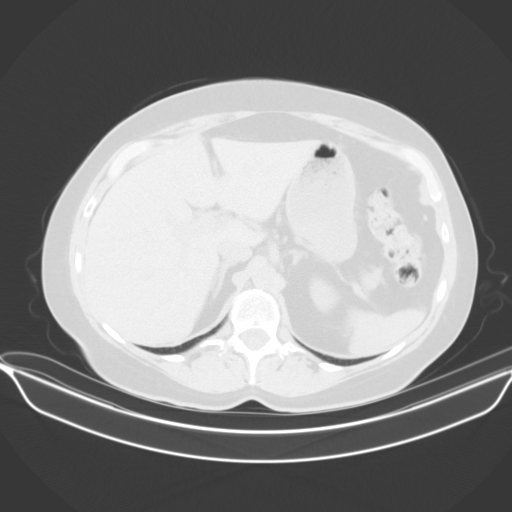
[im 71/85  soft-tissue]
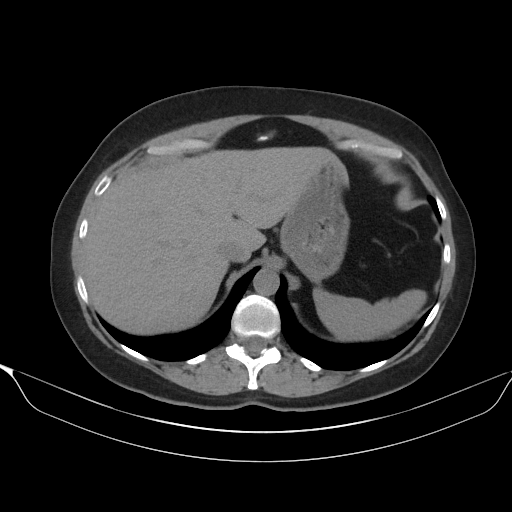
[im 71/85  lung]
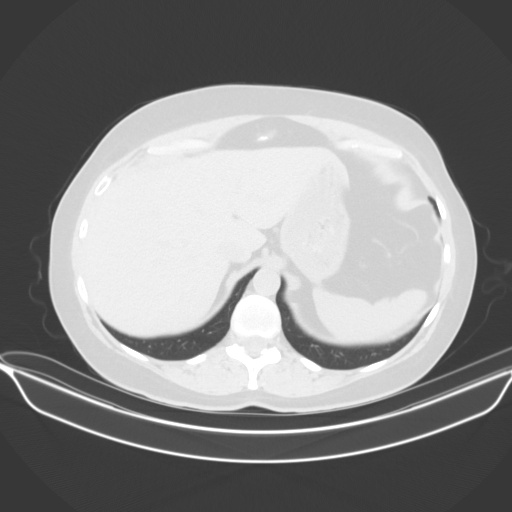
[im 76/85  lung]
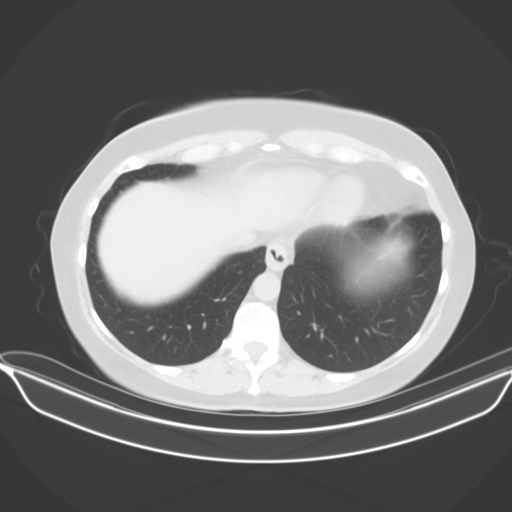
[im 80/85  soft-tissue]
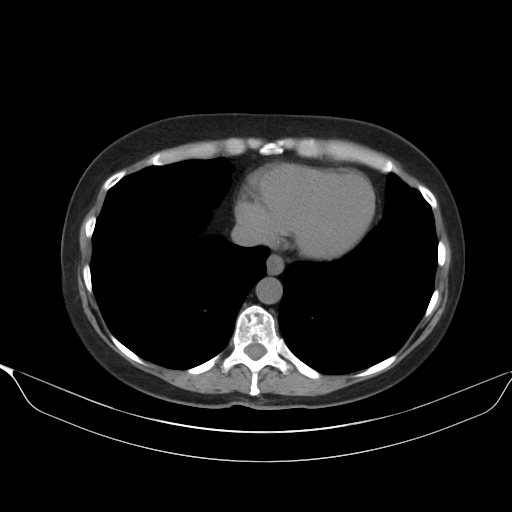
[im 80/85  lung]
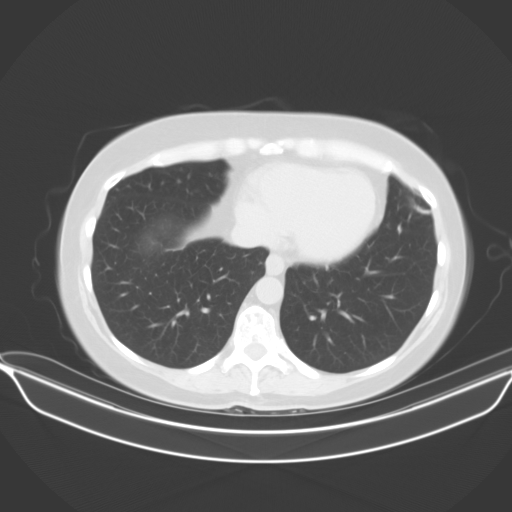

[14 of 32 positions shown; findings below may reference images not displayed]

FINDINGS: Lower chest: No significant pulmonary nodules or acute consolidative
airspace disease.

Hepatobiliary: Normal liver size. No liver mass. Normal gallbladder
with no radiopaque cholelithiasis. No biliary ductal dilatation.

Pancreas: Normal, with no mass or duct dilation.

Spleen: Normal size. No mass.

Adrenals/Urinary Tract: Normal adrenals. No renal stones. No
hydronephrosis. No contour deforming renal masses. Normal caliber
ureters. No ureteral stones. Normal bladder.

Stomach/Bowel: Small hiatal hernia. Otherwise normal nondistended
stomach. Normal caliber small bowel with no small bowel wall
thickening. Normal appendix. Minimal sigmoid diverticulosis with no
large bowel wall thickening or significant pericolonic fat
stranding.

Vascular/Lymphatic: Normal caliber abdominal aorta. No
pathologically enlarged lymph nodes in the abdomen or pelvis.

Reproductive: Grossly normal uterus.  No adnexal mass.

Other: No pneumoperitoneum, ascites or focal fluid collection. Small
fat containing periumbilical hernia.

Musculoskeletal: No aggressive appearing focal osseous lesions.
Moderate lower lumbar spondylosis.
IMPRESSION: 1. No acute abnormality. No urolithiasis. No hydronephrosis.
2. Small hiatal hernia.
3. Minimal sigmoid diverticulosis.
4. Small fat containing periumbilical hernia.

## 2022-06-17 ENCOUNTER — Other Ambulatory Visit: Payer: Self-pay | Admitting: Internal Medicine

## 2022-06-17 DIAGNOSIS — E785 Hyperlipidemia, unspecified: Secondary | ICD-10-CM

## 2022-07-16 ENCOUNTER — Other Ambulatory Visit: Payer: Self-pay | Admitting: Internal Medicine

## 2022-07-16 DIAGNOSIS — E785 Hyperlipidemia, unspecified: Secondary | ICD-10-CM

## 2022-08-08 ENCOUNTER — Other Ambulatory Visit: Payer: Self-pay

## 2022-09-12 ENCOUNTER — Other Ambulatory Visit: Payer: Self-pay

## 2022-10-17 ENCOUNTER — Ambulatory Visit
Admission: RE | Admit: 2022-10-17 | Discharge: 2022-10-17 | Disposition: A | Discharge status: Home / Self Care | Payer: No Typology Code available for payment source | Source: Ambulatory Visit | Attending: Internal Medicine | Admitting: Internal Medicine

## 2022-10-17 DIAGNOSIS — E785 Hyperlipidemia, unspecified: Secondary | ICD-10-CM

## 2022-11-01 ENCOUNTER — Ambulatory Visit: Payer: Self-pay

## 2022-11-02 ENCOUNTER — Encounter: Payer: Self-pay | Admitting: Emergency Medicine

## 2022-11-02 ENCOUNTER — Ambulatory Visit
Admission: EM | Admit: 2022-11-02 | Discharge: 2022-11-02 | Disposition: A | Discharge status: Home / Self Care | Payer: Self-pay | Attending: Family Medicine | Admitting: Family Medicine

## 2022-11-02 DIAGNOSIS — N3001 Acute cystitis with hematuria: Secondary | ICD-10-CM | POA: Insufficient documentation

## 2022-11-02 LAB — URINALYSIS, W/ REFLEX TO CULTURE (INFECTION SUSPECTED)
Bilirubin Urine: NEGATIVE
Glucose, UA: NEGATIVE mg/dL
Ketones, ur: NEGATIVE mg/dL
Nitrite: NEGATIVE
Protein, ur: NEGATIVE mg/dL
Specific Gravity, Urine: 1.025 (ref 1.005–1.030)
pH: 7 (ref 5.0–8.0)

## 2022-11-02 MED ORDER — NITROFURANTOIN MONOHYD MACRO 100 MG PO CAPS: 100.0000 mg | ORAL_CAPSULE | Freq: Two times a day (BID) | ORAL | 0 refills | Status: AC

## 2022-11-02 NOTE — ED Triage Notes (Signed)
Pt c/o dysuria. Started about 3 days ago. She states she has pain in her bladder area. She has tried pushing fluids and cranberry pills. Denies fever, or lower back pain.

## 2022-11-02 NOTE — ED Provider Notes (Signed)
[facility]    CSN: [id number] Arrival date & time: 11/02/22  1211      History   Chief Complaint Chief Complaint  Patient presents with   Dysuria     HPI HPI Ardice Boyan is a 62 y.o. female.    Fred Hammes presents for dysuria for the past 3 days.  Tried Motrin, cranberry pills and drinking plenty of water prior to arrival.  Has  not had any antibiotics in last 30 days.   Says she tends to get a urinary tract infection every year and half or so.  She is married and they do not use condoms.  Denies known STI exposure. No LMP recorded. Patient is postmenopausal.    - Abnormal vaginal discharge: no - vaginal odor: no - vaginal bleeding: no - Dysuria: yes - Hematuria: no - Urinary urgency:no  - Urinary frequency: yes  - Fever: no - Abdominal pain: yes, lower  - Pelvic pain: no - Rash/Skin lesions/mouth ulcers: no - Nausea: no  - Vomiting: no  - Back Pain: yes        Past Medical History:  Diagnosis Date   Anxiety    past    Hyperlipidemia     There are no problems to display for this patient.   Past Surgical History:  Procedure Laterality Date   COLONOSCOPY     POLYPECTOMY      OB History   No obstetric history on file.      Home Medications    Prior to Admission medications   Medication Sig Start Date End Date Taking? Authorizing Provider  cholecalciferol (VITAMIN D3) 25 MCG (1000 UNIT) tablet Take 1,000 Units by mouth daily.   Yes [provider]  co-enzyme Q-10 30 MG capsule Take 30 mg by mouth 3 (three) times daily.   Yes [provider]  Multiple Vitamin (MULTIVITAMIN) tablet Take 1 tablet by mouth daily.   Yes [provider]  nitrofurantoin, macrocrystal-monohydrate, (MACROBID) 100 MG capsule Take 1 capsule (100 mg total) by mouth 2 (two) times daily. 11/02/22  Yes Yaneliz Radebaugh, DO  Red Yeast Rice Extract (RED YEAST RICE PO) Take by mouth.   Yes [provider]  Ascorbic Acid  (VITAMIN C) 1000 MG tablet Take 1,000 mg by mouth daily.    [provider]  Omega-3 Fatty Acids (FISH OIL) 1000 MG CAPS Take by mouth.    [provider]    Family History Family History  Problem Relation Age of Onset   Diabetes Mother    Diabetes Father    Breast cancer Maternal Grandmother    Colon cancer Neg Hx    Rectal cancer Neg Hx    Stomach cancer Neg Hx    Colon polyps Neg Hx    Esophageal cancer Neg Hx     Social History Social History   Tobacco Use   Smoking status: Never   Smokeless tobacco: Never  Vaping Use   Vaping status: Never Used  Substance Use Topics   Alcohol use: Yes    Comment: 2-4 glasses of wine/week   Drug use: No     Allergies   Penicillins   Review of Systems Review of Systems: :negative unless otherwise stated in HPI.      Physical Exam Triage Vital Signs ED Triage Vitals  Encounter Vitals Group     BP 11/02/22 1253 135/82     Systolic BP Percentile --      Diastolic BP Percentile --  Pulse Rate 11/02/22 1253 71     Resp 11/02/22 1253 16     Temp 11/02/22 1253 98.1 F (36.7 C)     Temp Source 11/02/22 1253 Oral     SpO2 11/02/22 1253 96 %     Weight 11/02/22 1252 158 lb 1.1 oz (71.7 kg)     Height 11/02/22 1252 5' 5.5" (1.664 m)     Head Circumference --      Peak Flow --      Pain Score 11/02/22 1251 3     Pain Loc --      Pain Education --      Exclude from Growth Chart --    No data found.  Updated Vital Signs BP 135/82 (BP Location: Left Arm)   Pulse 71   Temp 98.1 F (36.7 C) (Oral)   Resp 16   Ht 5' 5.5" (1.664 m)   Wt 71.7 kg   SpO2 96%   BMI 25.90 kg/m   Visual Acuity Right Eye Distance:   Left Eye Distance:   Bilateral Distance:    Right Eye Near:   Left Eye Near:    Bilateral Near:     Physical Exam GEN: well appearing female in no acute distress  CVS: well perfused  RESP: speaking in full sentences without pause  ABD: soft, non-tender, non-distended, no palpable  masses, no CVA tenderness      UC Treatments / Results  Labs (all labs ordered are listed, but only abnormal results are displayed) Labs Reviewed  URINE CULTURE   URINALYSIS, W/ REFLEX TO CULTURE (INFECTION SUSPECTED) - Abnormal; Notable for the following components:   Hgb urine dipstick TRACE (*)    Leukocytes,Ua MODERATE (*)    Bacteria, UA FEW (*)    All other components within normal limits    EKG   Radiology No results found.  Procedures Procedures (including critical care time)  Medications Ordered in UC Medications - No data to display  Initial Impression / Assessment and Plan / UC Course  I have reviewed the triage vital signs and the nursing notes.  Pertinent labs & imaging results that were available during my care of the patient were reviewed by me and considered in my medical decision making (see chart for details).     Acute cystitis:  Patient is a 62 y.o. female  who presents for 3 days of dysuria.  Overall patient is well-appearing and afebrile.  Vital signs stable.  UA consistent with acute cystitis.  Hematuria supported on microscopy.  Macrobid 2 times daily for 5 days.  Urine culture obtained.  Follow-up sensitivities and change antibiotics, if needed.   - Return precautions including abdominal pain, fever, chills, nausea, or vomiting given. Follow-up,  if symptoms not improving or getting worse. Discussed MDM, treatment plan and plan for follow-up with patient who agrees with plan.        Final Clinical Impressions(s) / UC Diagnoses   Final diagnoses:  Acute cystitis with hematuria     Discharge Instructions      Stop by the pharmacy to pick up your prescription.  I sent your urine for culture, if for some reason you need to stop or change antibiotics someone will contact you.     ED Prescriptions     Medication Sig Dispense Auth. Provider   nitrofurantoin, macrocrystal-monohydrate, (MACROBID) 100 MG capsule Take 1 capsule (100 mg  total) by mouth 2 (two) times daily. 10 capsule Katha Cabal, DO  PDMP not reviewed this encounter.   Katha Cabal, DO 11/05/22 1120

## 2022-11-02 NOTE — Discharge Instructions (Signed)
Stop by the pharmacy to pick up your prescription.  I sent your urine for culture, if for some reason you need to stop or change antibiotics someone will contact you.

## 2022-11-03 LAB — URINE CULTURE: Culture: 40000 — AB

## 2022-11-04 LAB — URINE CULTURE

## 2024-03-10 ENCOUNTER — Encounter: Payer: Self-pay | Admitting: Emergency Medicine

## 2024-03-10 ENCOUNTER — Ambulatory Visit
Admission: EM | Admit: 2024-03-10 | Discharge: 2024-03-10 | Disposition: A | Discharge status: Home / Self Care | Payer: Self-pay | Attending: Emergency Medicine | Admitting: Emergency Medicine

## 2024-03-10 DIAGNOSIS — R300 Dysuria: Secondary | ICD-10-CM | POA: Insufficient documentation

## 2024-03-10 LAB — POCT URINE DIPSTICK
Bilirubin, UA: NEGATIVE
Glucose, UA: NEGATIVE mg/dL
Leukocytes, UA: NEGATIVE
Nitrite, UA: NEGATIVE
Protein Ur, POC: NEGATIVE mg/dL
Spec Grav, UA: 1.015 (ref 1.010–1.025)
Urobilinogen, UA: 0.2 U/dL
pH, UA: 7.5 (ref 5.0–8.0)

## 2024-03-10 NOTE — Discharge Instructions (Addendum)
 Continue to increase your oral fluid intake such increase urine production to flush your urinary tract.  You may continue to use over-the-counter Azo to help with urinary discomfort.  Your culture results should be back in the next couple days and your cytology swab should be back tomorrow.  If your cytology swab is positive for any infection to be contacted by phone and treatment options will be provided.  Same with your urine culture.  If you develop any new or worsening symptoms please return for reevaluation or see your primary care provider.

## 2024-03-10 NOTE — ED Provider Notes (Signed)
 [facility]    CSN: [id number] Arrival date & time: 03/10/24  1338      History   Chief Complaint Chief Complaint  Patient presents with   Urinary Tract Infection    HPI Angela Hensley is a 63 y.o. female.   HPI  63 year old female with past medical history significant for hyperlipidemia and anxiety presents for evaluation of cramping during urination and urinary discomfort that started 2 weeks ago and worsened over the last week.  She is also been experiencing increased nocturia, urgency, frequency, and suprapubic discomfort.  No fever, blood in the urine, or low back pain.  She denies any vaginal symptoms.  Past Medical History:  Diagnosis Date   Anxiety    past    Hyperlipidemia     There are no active problems to display for this patient.   Past Surgical History:  Procedure Laterality Date   COLONOSCOPY     POLYPECTOMY      OB History   No obstetric history on file.      Home Medications    Prior to Admission medications   Medication Sig Start Date End Date Taking? Authorizing Provider  Ascorbic Acid (VITAMIN C) 1000 MG tablet Take 1,000 mg by mouth daily.   Yes [provider]  cholecalciferol (VITAMIN D3) 25 MCG (1000 UNIT) tablet Take 1,000 Units by mouth daily.   Yes [provider]  co-enzyme Q-10 30 MG capsule Take 30 mg by mouth 3 (three) times daily.   Yes [provider]  Multiple Vitamin (MULTIVITAMIN) tablet Take 1 tablet by mouth daily.   Yes [provider]  Omega-3 Fatty Acids (FISH OIL) 1000 MG CAPS Take by mouth.   Yes [provider]  Red Yeast Rice Extract (RED YEAST RICE PO) Take by mouth.   Yes [provider]  nitrofurantoin , macrocrystal-monohydrate, (MACROBID ) 100 MG capsule Take 1 capsule (100 mg total) by mouth 2 (two) times daily. 11/02/22   Kriste Berth, DO    Family History Family History  Problem Relation Age of Onset   Diabetes Mother    Diabetes Father     Breast cancer Maternal Grandmother    Colon cancer Neg Hx    Rectal cancer Neg Hx    Stomach cancer Neg Hx    Colon polyps Neg Hx    Esophageal cancer Neg Hx     Social History Social History   Tobacco Use   Smoking status: Never   Smokeless tobacco: Never  Vaping Use   Vaping status: Never Used  Substance Use Topics   Alcohol use: Yes    Comment: 2-4 glasses of wine/week   Drug use: No     Allergies   Penicillins   Review of Systems Review of Systems  Constitutional:  Negative for fever.  Gastrointestinal:  Positive for abdominal pain.       Suprapubic pain  Genitourinary:  Positive for dysuria, frequency and urgency. Negative for hematuria, vaginal discharge and vaginal pain.  Musculoskeletal:  Negative for back pain.     Physical Exam Triage Vital Signs ED Triage Vitals  Encounter Vitals Group     BP      Girls Systolic BP Percentile      Girls Diastolic BP Percentile      Boys Systolic BP Percentile      Boys Diastolic BP Percentile      Pulse      Resp      Temp  Temp src      SpO2      Weight      Height      Head Circumference      Peak Flow      Pain Score      Pain Loc      Pain Education      Exclude from Growth Chart    No data found.  Updated Vital Signs BP 128/68 (BP Location: Left Arm)   Pulse 72   Temp 98.1 F (36.7 C) (Oral)   SpO2 99%   Visual Acuity Right Eye Distance:   Left Eye Distance:   Bilateral Distance:    Right Eye Near:   Left Eye Near:    Bilateral Near:     Physical Exam Vitals and nursing note reviewed.  Constitutional:      Appearance: Normal appearance. She is not ill-appearing.  HENT:     Head: Normocephalic and atraumatic.  Cardiovascular:     Rate and Rhythm: Normal rate and regular rhythm.     Pulses: Normal pulses.     Heart sounds: Normal heart sounds. No murmur heard.    No friction rub. No gallop.  Pulmonary:     Effort: Pulmonary effort is normal.     Breath sounds: Normal  breath sounds. No wheezing, rhonchi or rales.  Abdominal:     Tenderness: There is no right CVA tenderness or left CVA tenderness.  Skin:    General: Skin is warm and dry.     Capillary Refill: Capillary refill takes less than 2 seconds.     Findings: No rash.  Neurological:     General: No focal deficit present.     Mental Status: She is alert and oriented to person, place, and time.      UC Treatments / Results  Labs (all labs ordered are listed, but only abnormal results are displayed) Labs Reviewed  POCT URINE DIPSTICK - Abnormal; Notable for the following components:      Result Value   Ketones, POC UA trace (5) (*)    Blood, UA small (*)    All other components within normal limits  URINE CULTURE  CERVICOVAGINAL ANCILLARY ONLY    EKG   Radiology No results found.  Procedures Procedures (including critical care time)  Medications Ordered in UC Medications - No data to display  Initial Impression / Assessment and Plan / UC Course  I have reviewed the triage vital signs and the nursing notes.  Pertinent labs & imaging results that were available during my care of the patient were reviewed by me and considered in my medical decision making (see chart for details).   Patient is a pleasant, nontoxic-appearing 63 year old female presenting for evaluation of 2 weeks with a UTI symptoms as outlined in HPI above.  She is describing it as a cramping and discomfort when she urinates along with urgency and frequency.  No hematuria.  No CVA tenderness but she does endorse some suprapubic tenderness.  I will order a urinalysis to assess the presence of UTI.  Urinalysis has trace ketones and small intact RBCs.  Negative leukocyte esterase or nitrites.  I will send urine for culture.  I will also have the patient collect a vaginal cytology swab to assess for the presence of BV or yeast.  I discussed the negative findings of the urinalysis with the patient and she has indicated  that she has had abnormal dip before but the culture came back positive so  I have ordered a urine culture.  She will continue to push water and use Azo for the discomfort pending the results of the culture as well as a cytology swab.   Final Clinical Impressions(s) / UC Diagnoses   Final diagnoses:  Dysuria     Discharge Instructions      Continue to increase your oral fluid intake such increase urine production to flush your urinary tract.  You may continue to use over-the-counter Azo to help with urinary discomfort.  Your culture results should be back in the next couple days and your cytology swab should be back tomorrow.  If your cytology swab is positive for any infection to be contacted by phone and treatment options will be provided.  Same with your urine culture.  If you develop any new or worsening symptoms please return for reevaluation or see your primary care provider.     ED Prescriptions   None    PDMP not reviewed this encounter.   Bernardino Ditch, NP 03/10/24 1538

## 2024-03-10 NOTE — ED Triage Notes (Signed)
 Pt c/o possible UTI  Pt states that she has had cramping during urination and discomfort

## 2024-03-11 LAB — CERVICOVAGINAL ANCILLARY ONLY
Bacterial Vaginitis (gardnerella): NEGATIVE
Candida Glabrata: NEGATIVE
Candida Vaginitis: NEGATIVE
Comment: NEGATIVE
Comment: NEGATIVE
Comment: NEGATIVE

## 2024-03-11 LAB — URINE CULTURE
Culture: NO GROWTH
Special Requests: NORMAL

## 2024-03-14 ENCOUNTER — Ambulatory Visit (HOSPITAL_COMMUNITY): Payer: Self-pay
